# Patient Record
Sex: Female | Born: 1960 | Race: White | Hispanic: No | Marital: Married | Smoking: Former smoker
Health system: Southern US, Community
[De-identification: ages and names within clinical notes are randomized; demographics above are authoritative.]

## PROBLEM LIST (undated history)

## (undated) DIAGNOSIS — R002 Palpitations: Secondary | ICD-10-CM

## (undated) DIAGNOSIS — D259 Leiomyoma of uterus, unspecified: Secondary | ICD-10-CM

## (undated) DIAGNOSIS — A6000 Herpesviral infection of urogenital system, unspecified: Secondary | ICD-10-CM

## (undated) DIAGNOSIS — J45909 Unspecified asthma, uncomplicated: Secondary | ICD-10-CM

## (undated) DIAGNOSIS — R232 Flushing: Secondary | ICD-10-CM

## (undated) DIAGNOSIS — T7840XA Allergy, unspecified, initial encounter: Secondary | ICD-10-CM

## (undated) DIAGNOSIS — H269 Unspecified cataract: Secondary | ICD-10-CM

## (undated) DIAGNOSIS — F329 Major depressive disorder, single episode, unspecified: Secondary | ICD-10-CM

## (undated) DIAGNOSIS — Z86010 Personal history of colonic polyps: Secondary | ICD-10-CM

## (undated) DIAGNOSIS — E785 Hyperlipidemia, unspecified: Secondary | ICD-10-CM

## (undated) DIAGNOSIS — K219 Gastro-esophageal reflux disease without esophagitis: Secondary | ICD-10-CM

## (undated) DIAGNOSIS — J309 Allergic rhinitis, unspecified: Secondary | ICD-10-CM

## (undated) HISTORY — DX: Gastro-esophageal reflux disease without esophagitis: K21.9

## (undated) HISTORY — DX: Flushing: R23.2

## (undated) HISTORY — DX: Leiomyoma of uterus, unspecified: D25.9

## (undated) HISTORY — DX: Major depressive disorder, single episode, unspecified: F32.9

## (undated) HISTORY — DX: Unspecified cataract: H26.9

## (undated) HISTORY — DX: Unspecified asthma, uncomplicated: J45.909

## (undated) HISTORY — DX: Herpesviral infection of urogenital system, unspecified: A60.00

## (undated) HISTORY — PX: COLONOSCOPY: SHX174

## (undated) HISTORY — DX: Allergic rhinitis, unspecified: J30.9

## (undated) HISTORY — DX: Allergy, unspecified, initial encounter: T78.40XA

## (undated) HISTORY — DX: Palpitations: R00.2

## (undated) HISTORY — DX: Personal history of colonic polyps: Z86.010

## (undated) HISTORY — PX: POLYPECTOMY: SHX149

## (undated) HISTORY — DX: Hyperlipidemia, unspecified: E78.5

---

## 1988-06-25 HISTORY — PX: WISDOM TOOTH EXTRACTION: SHX21

## 1999-02-22 ENCOUNTER — Other Ambulatory Visit: Admission: RE | Admit: 1999-02-22 | Discharge: 1999-02-22 | Payer: Self-pay | Admitting: Obstetrics & Gynecology

## 1999-02-22 ENCOUNTER — Encounter (INDEPENDENT_AMBULATORY_CARE_PROVIDER_SITE_OTHER): Payer: Self-pay

## 2002-05-14 ENCOUNTER — Other Ambulatory Visit: Admission: RE | Admit: 2002-05-14 | Discharge: 2002-05-14 | Payer: Self-pay | Admitting: Obstetrics & Gynecology

## 2003-07-23 ENCOUNTER — Other Ambulatory Visit: Admission: RE | Admit: 2003-07-23 | Discharge: 2003-07-23 | Payer: Self-pay | Admitting: Obstetrics & Gynecology

## 2004-10-05 ENCOUNTER — Other Ambulatory Visit: Admission: RE | Admit: 2004-10-05 | Discharge: 2004-10-05 | Payer: Self-pay | Admitting: Obstetrics & Gynecology

## 2004-11-22 ENCOUNTER — Ambulatory Visit: Payer: Self-pay | Admitting: Pulmonary Disease

## 2005-06-07 ENCOUNTER — Ambulatory Visit: Payer: Self-pay | Admitting: Pulmonary Disease

## 2006-03-21 ENCOUNTER — Ambulatory Visit: Payer: Self-pay | Admitting: Pulmonary Disease

## 2006-07-09 ENCOUNTER — Ambulatory Visit: Payer: Self-pay | Admitting: Pulmonary Disease

## 2006-07-09 LAB — CONVERTED CEMR LAB
ALT: 9 units/L (ref 0–40)
Basophils Relative: 0.6 % (ref 0.0–1.0)
Bilirubin Urine: NEGATIVE
Calcium: 8.7 mg/dL (ref 8.4–10.5)
Chloride: 103 meq/L (ref 96–112)
Creatinine, Ser: 1 mg/dL (ref 0.4–1.2)
Eosinophils Relative: 0.5 % (ref 0.0–5.0)
GFR calc Af Amer: 77 mL/min
Glucose, Bld: 83 mg/dL (ref 70–99)
HDL: 53.9 mg/dL (ref >39.0)
Hemoglobin, Urine: NEGATIVE
Ketones, ur: NEGATIVE mg/dL
LDL Cholesterol: 102 mg/dL — ABNORMAL HIGH (ref 0–99)
Lymphocytes Relative: 31.1 % (ref 12.0–46.0)
MCHC: 33 g/dL (ref 30.0–36.0)
MCV: 93 fL (ref 78.0–100.0)
Monocytes Absolute: 0.4 10*3/uL (ref 0.2–0.7)
Neutro Abs: 4.1 10*3/uL (ref 1.4–7.7)
Platelets: 255 10*3/uL (ref 150–400)
Sodium: 136 meq/L (ref 135–145)
Specific Gravity, Urine: >=1.03 (ref 1.000–1.03)
TSH: 1.31 microintl units/mL (ref 0.35–5.50)
Total CHOL/HDL Ratio: 3.2
Total Protein, Urine: NEGATIVE mg/dL
Triglycerides: 86 mg/dL (ref 0–149)
Urine Glucose: NEGATIVE mg/dL
VLDL: 17 mg/dL (ref 0–40)
WBC: 6.5 10*3/uL (ref 4.5–10.5)
pH: 6 (ref 5.0–8.0)

## 2007-02-03 ENCOUNTER — Encounter: Admission: RE | Admit: 2007-02-03 | Discharge: 2007-02-03 | Payer: Self-pay | Admitting: Obstetrics & Gynecology

## 2007-06-13 ENCOUNTER — Telehealth: Payer: Self-pay | Admitting: Pulmonary Disease

## 2007-06-27 ENCOUNTER — Emergency Department (HOSPITAL_COMMUNITY): Admission: EM | Admit: 2007-06-27 | Discharge: 2007-06-27 | Payer: Self-pay | Admitting: Emergency Medicine

## 2007-06-30 ENCOUNTER — Telehealth (INDEPENDENT_AMBULATORY_CARE_PROVIDER_SITE_OTHER): Payer: Self-pay | Admitting: *Deleted

## 2007-06-30 ENCOUNTER — Ambulatory Visit: Payer: Self-pay | Admitting: Pulmonary Disease

## 2007-06-30 DIAGNOSIS — J209 Acute bronchitis, unspecified: Secondary | ICD-10-CM | POA: Insufficient documentation

## 2007-07-02 ENCOUNTER — Encounter: Payer: Self-pay | Admitting: Pulmonary Disease

## 2007-09-23 ENCOUNTER — Ambulatory Visit: Payer: Self-pay | Admitting: Pulmonary Disease

## 2007-09-23 DIAGNOSIS — F3289 Other specified depressive episodes: Secondary | ICD-10-CM

## 2007-09-23 DIAGNOSIS — E785 Hyperlipidemia, unspecified: Secondary | ICD-10-CM

## 2007-09-23 DIAGNOSIS — F329 Major depressive disorder, single episode, unspecified: Secondary | ICD-10-CM

## 2007-09-23 DIAGNOSIS — J309 Allergic rhinitis, unspecified: Secondary | ICD-10-CM

## 2007-09-23 DIAGNOSIS — F32A Depression, unspecified: Secondary | ICD-10-CM | POA: Insufficient documentation

## 2007-09-23 DIAGNOSIS — K219 Gastro-esophageal reflux disease without esophagitis: Secondary | ICD-10-CM | POA: Insufficient documentation

## 2007-09-23 HISTORY — DX: Hyperlipidemia, unspecified: E78.5

## 2007-09-23 HISTORY — DX: Major depressive disorder, single episode, unspecified: F32.9

## 2007-09-23 HISTORY — DX: Other specified depressive episodes: F32.89

## 2007-09-23 HISTORY — DX: Allergic rhinitis, unspecified: J30.9

## 2007-09-23 LAB — CONVERTED CEMR LAB: Vit D, 1,25-Dihydroxy: 55 (ref 30–89)

## 2007-10-02 ENCOUNTER — Telehealth: Payer: Self-pay | Admitting: Pulmonary Disease

## 2008-03-11 ENCOUNTER — Telehealth (INDEPENDENT_AMBULATORY_CARE_PROVIDER_SITE_OTHER): Payer: Self-pay | Admitting: *Deleted

## 2008-03-24 ENCOUNTER — Ambulatory Visit: Payer: Self-pay | Admitting: Pulmonary Disease

## 2008-06-15 ENCOUNTER — Ambulatory Visit: Payer: Self-pay | Admitting: Pulmonary Disease

## 2008-12-27 ENCOUNTER — Emergency Department (HOSPITAL_COMMUNITY): Admission: EM | Admit: 2008-12-27 | Discharge: 2008-12-27 | Payer: Self-pay | Admitting: Emergency Medicine

## 2009-02-25 ENCOUNTER — Emergency Department (HOSPITAL_COMMUNITY): Admission: EM | Admit: 2009-02-25 | Discharge: 2009-02-25 | Payer: Self-pay | Admitting: Emergency Medicine

## 2009-03-18 ENCOUNTER — Ambulatory Visit: Payer: Self-pay | Admitting: Pulmonary Disease

## 2009-03-19 LAB — CONVERTED CEMR LAB
LDL Cholesterol: 119 mg/dL — ABNORMAL HIGH (ref 0–99)
Total CHOL/HDL Ratio: 4
Triglycerides: 71 mg/dL (ref 0.0–149.0)
VLDL: 14.2 mg/dL (ref 0.0–40.0)

## 2009-06-10 ENCOUNTER — Telehealth: Payer: Self-pay | Admitting: Pulmonary Disease

## 2010-03-21 ENCOUNTER — Telehealth (INDEPENDENT_AMBULATORY_CARE_PROVIDER_SITE_OTHER): Payer: Self-pay | Admitting: *Deleted

## 2010-05-25 ENCOUNTER — Ambulatory Visit: Payer: Self-pay | Admitting: Pulmonary Disease

## 2010-05-27 LAB — CONVERTED CEMR LAB
AST: 23 units/L (ref 0–37)
Albumin: 3.9 g/dL (ref 3.5–5.2)
BUN: 11 mg/dL (ref 6–23)
Basophils Relative: 0.7 % (ref 0.0–3.0)
Calcium: 8.4 mg/dL (ref 8.4–10.5)
Direct LDL: 126.3 mg/dL
Eosinophils Relative: 0.4 % (ref 0.0–5.0)
GFR calc non Af Amer: 65.67 mL/min (ref >60)
Glucose, Bld: 79 mg/dL (ref 70–99)
HCT: 39.3 % (ref 36.0–46.0)
Hemoglobin: 13.6 g/dL (ref 12.0–15.0)
Lymphs Abs: 2.9 10*3/uL (ref 0.7–4.0)
MCV: 91.7 fL (ref 78.0–100.0)
Monocytes Absolute: 0.7 10*3/uL (ref 0.1–1.0)
Monocytes Relative: 7.8 % (ref 3.0–12.0)
Neutro Abs: 4.7 10*3/uL (ref 1.4–7.7)
Platelets: 252 10*3/uL (ref 150.0–400.0)
Potassium: 3.9 meq/L (ref 3.5–5.1)
Sodium: 135 meq/L (ref 135–145)
TSH: 1.16 microintl units/mL (ref 0.35–5.50)
Total Bilirubin: 1.3 mg/dL — ABNORMAL HIGH (ref 0.3–1.2)
Total CHOL/HDL Ratio: 4
VLDL: 19.4 mg/dL (ref 0.0–40.0)
WBC: 8.3 10*3/uL (ref 4.5–10.5)

## 2010-07-16 ENCOUNTER — Encounter: Payer: Self-pay | Admitting: Obstetrics & Gynecology

## 2010-07-23 LAB — CONVERTED CEMR LAB
ALT: 18 units/L (ref 0–35)
AST: 21 units/L (ref 0–37)
Albumin: 3.8 g/dL (ref 3.5–5.2)
BUN: 9 mg/dL (ref 6–23)
Basophils Absolute: 0 10*3/uL (ref 0.0–0.1)
Basophils Relative: 0.3 % (ref 0.0–1.0)
Bilirubin Urine: NEGATIVE
Calcium: 8.7 mg/dL (ref 8.4–10.5)
Chloride: 104 meq/L (ref 96–112)
Creatinine, Ser: 1 mg/dL (ref 0.4–1.2)
Eosinophils Absolute: 0 10*3/uL (ref 0.0–0.7)
GFR calc non Af Amer: 63 mL/min
HCT: 43 % (ref 36.0–46.0)
Hemoglobin, Urine: NEGATIVE
Hemoglobin: 14.1 g/dL (ref 12.0–15.0)
LDL Cholesterol: 100 mg/dL — ABNORMAL HIGH (ref 0–99)
Leukocytes, UA: NEGATIVE
Lymphocytes Relative: 30.1 % (ref 12.0–46.0)
MCHC: 32.8 g/dL (ref 30.0–36.0)
MCV: 92.7 fL (ref 78.0–100.0)
Monocytes Absolute: 0.4 10*3/uL (ref 0.1–1.0)
Neutro Abs: 4.9 10*3/uL (ref 1.4–7.7)
Nitrite: NEGATIVE
RDW: 12.2 % (ref 11.5–14.6)
Specific Gravity, Urine: 1.02 (ref 1.000–1.03)
TSH: 1.66 microintl units/mL (ref 0.35–5.50)
Total Bilirubin: 0.9 mg/dL (ref 0.3–1.2)
Urine Glucose: NEGATIVE mg/dL
Urobilinogen, UA: 0.2 (ref 0.0–1.0)
WBC, UA: NONE SEEN cells/hpf
pH: 6 (ref 5.0–8.0)

## 2010-07-25 NOTE — Progress Notes (Signed)
Summary: Muscle spasms  Phone Note Call from Patient Call back at [phone]   Caller: pt Summary of Call: Pt c/o having muscle spasms in her back x 3 days. Pt states SN has prescribed a muscle relaxer in the past that has helped and wants a prescripion for a muscle relaxer.  Pt last seen 02-2009. She scheduled an appt for 05-25-10 at 11:30. Please advise.   [facility] Initial call taken by: Carron Curie CMA,  March 21, 2010 8:41 AM  Follow-up for Phone Call        per SN---ok for pt to have robaxin 500mg   #50   generic is ok--1 by mouth three times a day as needed for muscle spasms and refill x 5.  thanks Randell Loop CMA  March 21, 2010 9:48 AM   Additional Follow-up for Phone Call Additional follow up Details #1::        Spoke with pt and notified that rx was sent to pharm.   Additional Follow-up by: Vernie Murders,  March 21, 2010 9:56 AM    New/Updated Medications: ROBAXIN 500 MG TABS (METHOCARBAMOL) 1 three times a day as needed Prescriptions: ROBAXIN 500 MG TABS (METHOCARBAMOL) 1 three times a day as needed  #50 x 5   Entered by:   Vernie Murders   Authorized by:   Michele Mcalpine MD   Signed by:   Vernie Murders on 03/21/2010   Method used:   Electronically to        [facility]  [phone]* (retail)       [address] [place], [place]  [address]       Ph: [phone] or [phone]       Fax: [phone]   RxID:   [id number]

## 2010-07-25 NOTE — Assessment & Plan Note (Signed)
Summary: 1 year follow-up//jrc   CC:  14 month ROV & CPX....  History of Present Illness: 50 y/o WF here for a follow up visit and CPX... she is Claudia Warner daughter...   ~  March 18, 2009:  she has had some incr anxiety/ stress and developed palpit- went to ER 02/25/09 w/ neg eval... EKG- NAD;  CXR- NAD;  CT Angio- neg;  Labs- neg... pt was reassured & no meds given... prev eval & Rx by [name] for Psyche & prev on Wellbutrin & Lexapro but she weaned off all these 2/10 on her own... offered anxiolytic Rx vs return to see [name] & she states she will call Psyche for followup...   ~  May 25, 2010:  she is c/o tired alot & incr irritable, difficulty conc, emotional, "short fuse", HAs, etc... she never followed up w/ [name] as suggested last yr- we decided to Rx w/ Lexapro 10mg  samples & she will sched her own appt w/ psyche... states breathing is OK;  denies palpit as long as she avoid caffeine;  Chol is reasonable on diet Rx but not optimal (she doesn't want meds);  GERD controlled w/ PPI... we discussed Flu vaccine & TDAP today.   Current Problems:   PHYSICAL EXAMINATION (ICD-V70.0) - her GYN is [name] & [name] on Mononessa BCP now and Valtrex 500mg - 1/2 tab daily;  up-to-date on PAP, Mammogram, etc... she is on a BCP- calcium + vits...  ~  last colonoscopy 3/05 by [name] was normal... f/u planned 7 yrs.  ALLERGIC RHINITIS (ICD-477.9) - prev on shots & Flonase per DrESL... pt stopped these on her own due to weight gain...  ASTHMATIC BRONCHITIS, ACUTE (ICD-466.0) - she had a URI/ exac in Jan09 treated by TParrett,NP and improved...  Hx of PALPITATIONS (ICD-785.1) - hx occas palpit when stressed or from caffeine...  HYPERCHOLESTEROLEMIA, BORDERLINE (ICD-272.4) - on diet + exercise... she walks regularly and golfs some...  ~  FLP 1/08 showed TChol 173, TG 86, HDL 54, LDL 102  ~  FLP 3/09 showed TChol 175, TG 114, HDL 52, LDL 100  ~  FLP 9/10 showed TChol 181, TG 71, HDL 48, LDL  119  ~  FLP 11/11 showed TChol 205, TG 97, HDL 54, LDL 126... we discussed diet, exercise, etc.  GERD (ICD-530.81) - she takes PRILOSEC 20mg /d...  ANXIETY/ DEPRESSION (ICD-311) - Eval and Rx by [name]... prev on Wellbutrin XL and Lexapro- she weaned off meds on her own 2/10...   ~  9/10: pt went to ER w/ palpit & felt to be panic attack by EDP- she will ret to [name] for Rx (never did).  ~  11/11:  we gave samples LEXAPRO 10mg  & she has f/u appt w/ [name] in several weeks.   Preventive Screening-Counseling & Management  Alcohol-Tobacco     Smoking Status: quit     Year Quit: 1980's     Pack years: 1-2  Allergies: 1)  ! * Antihistamines  Comments:  Nurse/Medical Assistant: The patient's medications and allergies were reviewed with the patient and were updated in the Medication and Allergy Lists.  Past History:  Past Medical History: ALLERGIC RHINITIS (ICD-477.9) ASTHMATIC BRONCHITIS, ACUTE (ICD-466.0) Hx of PALPITATIONS (ICD-785.1) HYPERCHOLESTEROLEMIA, BORDERLINE (ICD-272.4) GERD (ICD-530.81) DEPRESSION (ICD-311)  Family History: Reviewed history and no changes required.  Social History: Reviewed history and no changes required.  Review of Systems       The patient complains of fatigue, malaise, palpitations, dyspnea on exertion, depression, and hay fever.  The patient denies fever, chills, sweats, anorexia, weakness, weight loss, sleep disorder, blurring, diplopia, eye irritation, eye discharge, vision loss, eye pain, photophobia, earache, ear discharge, tinnitus, decreased hearing, nasal congestion, nosebleeds, sore throat, hoarseness, chest pain, syncope, orthopnea, PND, peripheral edema, cough, dyspnea at rest, excessive sputum, hemoptysis, wheezing, pleurisy, nausea, vomiting, diarrhea, constipation, change in bowel habits, abdominal pain, melena, hematochezia, jaundice, gas/bloating, indigestion/heartburn, dysphagia, odynophagia, dysuria, hematuria, urinary frequency,  urinary hesitancy, nocturia, incontinence, back pain, joint pain, joint swelling, muscle cramps, muscle weakness, stiffness, arthritis, sciatica, restless legs, leg pain at night, leg pain with exertion, rash, itching, dryness, suspicious lesions, paralysis, paresthesias, seizures, tremors, vertigo, transient blindness, frequent falls, frequent headaches, difficulty walking, anxiety, memory loss, confusion, cold intolerance, heat intolerance, polydipsia, polyphagia, polyuria, unusual weight change, abnormal bruising, bleeding, enlarged lymph nodes, urticaria, allergic rash, and recurrent infections.    Vital Signs:  Patient profile:   50 year old female Height:      66 inches Weight:      196 pounds BMI:     31.75 O2 Sat:      98 % on room air Temp:     97.5 degrees F oral Pulse rate:   66 / minute BP sitting:   122 / 80  (right arm) Cuff size:   regular  Vitals Entered By: Randell Loop CMA (May 25, 2010 11:30 AM)  O2 Sat at Rest %:  98 O2 Flow:  room air CC: 14 month ROV & CPX... Is Patient Diabetic? No Pain Assessment Patient in pain? yes      Onset of pain  bil ear pain at times Comments meds updated today with pt   Physical Exam  Additional Exam:  WD, WN, 50 y/o WF in NAD... GENERAL:  Alert & oriented; pleasant & cooperative. HEENT:  [place]/AT, EOM-wnl, PERRLA, Fundi-benign, EACs-clear, TMs-wnl, NOSE-clear, THROAT-clear & wnl. NECK:  Supple w/ full ROM; no JVD; normal carotid impulses w/o bruits; no thyromegaly or nodules palpated; no lymphadenopathy. CHEST:  Clear to P & A; without wheezes/ rales/ or rhonchi. HEART:  Regular Rhythm; without murmurs/ rubs/ or gallops. ABDOMEN:  Soft & nontender; normal bowel sounds; no organomegaly or masses detected. EXT: without deformities or arthritic changes; no varicose veins/ venous insuffic/ or edema. NEURO:  CN's intact; motor testing normal; sensory testing normal; gait normal & balance OK. DERM:  No lesions noted; no rash  etc...    MISC. Report  Procedure date:  05/25/2010  Findings:      BMP (METABOL)   Sodium                    135 mEq/L                   135-145   Potassium                 3.9 mEq/L                   3.5-5.1   Chloride                  100 mEq/L                   96-112   Carbon Dioxide            26 mEq/L                    19-32   Glucose  79 mg/dL                    19-14   BUN                       11 mg/dL                    7-82   Creatinine                1.0 mg/dL                   9.5-6.2   Calcium                   8.4 mg/dL                   1.3-08.6   GFR                       65.67 mL/min                >60  Hepatic/Liver Function Panel (HEPATIC)   Total Bilirubin      [H]  1.3 mg/dL                   5.7-8.4   Direct Bilirubin          0.2 mg/dL                   6.9-6.2   Alkaline Phosphatase      68 U/L                      39-117   AST                       23 U/L                      0-37   ALT                       14 U/L                      0-35   Total Protein             7.1 g/dL                    9.5-2.8   Albumin                   3.9 g/dL                    4.1-3.2  CBC Platelet w/Diff (CBCD)   White Cell Count          8.3 K/uL                    4.5-10.5   Red Cell Count            4.28 Mil/uL                 3.87-5.11   Hemoglobin                13.6 g/dL                   44.0-10.2   Hematocrit  39.3 %                      36.0-46.0   MCV                       91.7 fl                     78.0-100.0   Platelet Count            252.0 K/uL                  150.0-400.0   Neutrophil %              56.2 %                      43.0-77.0   Lymphocyte %              34.9 %                      12.0-46.0   Monocyte %                7.8 %                       3.0-12.0   Eosinophils%              0.4 %                       0.0-5.0   Basophils %               0.7 %                       0.0-3.0  Comments:      Lipid Panel  (LIPID)   Cholesterol          [H]  205 mg/dL                   1-610   Triglycerides             97.0 mg/dL                  9.6-045.4   HDL                       09.81 mg/dL                 >19.14        Cholesterol LDL - Direct                             126.3 mg/dL  TSH (TSH)   FastTSH                   1.16 uIU/mL                 0.35-5.50   Impression & Recommendations:  Problem # 1:  PHYSICAL EXAMINATION (ICD-V70.0)  Orders: T-Vitamin D (25-Hydroxy) ([address]295-62130) TLB-BMP (Basic Metabolic Panel-BMET) (80048-METABOL) TLB-Hepatic/Liver Function Pnl (80076-HEPATIC) TLB-CBC Platelet - w/Differential (85025-CBCD) TLB-Lipid Panel (80061-LIPID) TLB-TSH (Thyroid Stimulating Hormone) (84443-TSH)  Problem # 2:  ASTHMATIC BRONCHITIS, ACUTE (ICD-466.0) No recent exac- doing well... The following medications were removed from the medication list:    Augmentin 875-125 Mg Tabs (Amoxicillin-pot clavulanate) .Marland KitchenMarland KitchenMarland KitchenMarland Kitchen  Take one tablet by mouth two times a day until gone  Problem # 3:  Hx of PALPITATIONS (ICD-785.1) Stable as long as she avoids pseudophed, caffeine, etc...  Problem # 4:  HYPERCHOLESTEROLEMIA, BORDERLINE (ICD-272.4) TChol & LDL are sl up>  rec diet efforts- low chol/ low fat...  Problem # 5:  GERD (ICD-530.81) stable on PPI daily... Her updated medication list for this problem includes:    Omeprazole 20 Mg Cpdr (Omeprazole) .Marland Kitchen... Take 1 tab by mouth once daily- 30 min before the 1st meal of the day...  Problem # 6:  DEPRESSION (ICD-311) We discussed restarting LEXAPRO 10mg  in advance of her f/u appt w/ [name]> smaples given.  Complete Medication List: 1)  Aspirin 81 Mg Tbec (Aspirin) .... Take 1 tablet by mouth once a day 2)  Omeprazole 20 Mg Cpdr (Omeprazole) .... Take 1 tab by mouth once daily- 30 min before the 1st meal of the day... 3)  Mononessa 0.25-35 Mg-mcg Tabs (Norgestimate-eth estradiol) .... Take 1 tablet by mouth once a day as directed by dr Okey Regal  curtisl... 4)  Valtrex 500 Mg Tabs (Valacyclovir hcl) .... 1/2 tablet by mouth as directed by dr Julio Sicks.Marland KitchenMarland Kitchen 5)  Caltrate 600+d 600-400 Mg-unit Tabs (Calcium carbonate-vitamin d) .... Take one tablet by mouth two times a day 6)  Centrum Tabs (Multiple vitamins-minerals) .... Take 1 tablet by mouth once a day 7)  Vitamin B-12 1000 Mcg Tabs (Cyanocobalamin) .... Take 1 tablet by mouth once a day 8)  Vitamin D 1000 Unit Tabs (Cholecalciferol) .... Take 1 tablet by mouth once a day 9)  Coq10 Maximum Strength 400 Mg Caps (Coenzyme q10) .... Take 1 tablet by mouth once a day 10)  Robaxin 500 Mg Tabs (Methocarbamol) .Marland Kitchen.. 1 three times a day as needed  Other Orders: Tdap => 70yrs IM ([address]) Admin 1st Vaccine ([address]) Influenza Vaccine NON MCR (09811)  Patient Instructions: 1)  Today we updated your med list- see below.... 2)  We refilled your Prilosec Rx & gave you a month's supply of the Lexapro 10mg  tabs... 3)  Today we did your follow up FASTING blood work... please call the "phone tree" in a few days for your lab results.Marland KitchenMarland Kitchen 4)  Call for any questions.Marland KitchenMarland Kitchen 5)  Please schedule a follow-up appointment in 1 year, sooner as needed... Prescriptions: OMEPRAZOLE 20 MG CPDR (OMEPRAZOLE) take 1 tab by mouth once daily- 30 min before the 1st meal of the day...  #90 x 4   Entered and Authorized by:   Michele Mcalpine MD   Signed by:   Michele Mcalpine MD on 05/25/2010   Method used:   Print then Give to Patient   RxID:   [id number]    Immunizations Administered:  Tetanus Vaccine:    Vaccine Type: Tdap    Site: left deltoid    Mfr: boostrix    Dose: 0.5 ml    Route: IM    Given by: Randell Loop CMA    Exp. Date: 03/15/2012    Lot #: [id number]    VIS given: 05/12/08 version given May 25, 2010.  Influenza Vaccine # 1:    Vaccine Type: Fluvax Non-MCR    Site: right deltoid    Mfr: GlaxoSmithKline    Dose: 0.5 ml    Route: IM    Given by: Randell Loop CMA    Exp. Date: 12/23/2010     Lot #: [id number]    VIS given: 01/17/10 version given May 25, 2010.  Flu Vaccine Consent Questions:    Do you have a history of severe allergic reactions to this vaccine? no    Any prior history of allergic reactions to egg and/or gelatin? no    Do you have a sensitivity to the preservative Thimersol? no    Do you have a past history of Guillan-Barre Syndrome? no    Do you currently have an acute febrile illness? no    Have you ever had a severe reaction to latex? no    Vaccine information given and explained to patient? yes    Are you currently pregnant? no

## 2010-09-07 ENCOUNTER — Encounter: Payer: Self-pay | Admitting: Internal Medicine

## 2010-09-12 NOTE — Letter (Signed)
Summary: Colonoscopy Date Change Letter  [facility]  [address]   [place], [place] [address]   Phone: [phone]  Fax: [phone]      September 07, 2010 MRN: [id number]   [facility] [address] [place], [place]  [address]   Dear Ms. Erb,   Previously you were recommended to have a repeat colonoscopy around this time. Your chart was recently reviewed by Dr. Leone Payor of [facility]. Follow up colonoscopy is now recommended in January 2013. This revised recommendation is based on current, nationally recognized guidelines for colorectal cancer screening and polyp surveillance. These guidelines are endorsed by the American Cancer Society, [employer] on Colorectal Cancer as well as numerous other major medical organizations.  Please understand that our recommendation assumes that you do not have any new symptoms such as bleeding, a change in bowel habits, anemia, or significant abdominal discomfort. If you do have any concerning GI symptoms or want to discuss the guideline recommendations, please call to arrange an office visit at your earliest convenience. Otherwise we will keep you in our reminder system and contact you 1-2 months prior to the date listed above to schedule your next colonoscopy.  Thank you,   Stan Head, M.D.  [facility] [phone]

## 2010-09-29 LAB — D-DIMER, QUANTITATIVE: D-Dimer, Quant: 0.77 ug/mL-FEU — ABNORMAL HIGH (ref 0.00–0.48)

## 2010-09-29 LAB — POCT CARDIAC MARKERS: Myoglobin, poc: 78.2 ng/mL (ref 12–200)

## 2010-09-29 LAB — URINALYSIS, ROUTINE W REFLEX MICROSCOPIC
Bilirubin Urine: NEGATIVE
Hgb urine dipstick: NEGATIVE
Ketones, ur: 15 mg/dL — AB
Specific Gravity, Urine: 1.025 (ref 1.005–1.030)
pH: 6 (ref 5.0–8.0)

## 2010-09-29 LAB — BASIC METABOLIC PANEL
CO2: 24 mEq/L (ref 19–32)
Calcium: 9.4 mg/dL (ref 8.4–10.5)
Creatinine, Ser: 0.95 mg/dL (ref 0.4–1.2)
GFR calc Af Amer: >60 mL/min (ref >60)
GFR calc non Af Amer: >60 mL/min (ref >60)
Sodium: 139 mEq/L (ref 135–145)

## 2010-09-29 LAB — CBC
Hemoglobin: 15.4 g/dL — ABNORMAL HIGH (ref 12.0–15.0)
MCHC: 34.2 g/dL (ref 30.0–36.0)
RBC: 4.77 MIL/uL (ref 3.87–5.11)
WBC: 7.6 10*3/uL (ref 4.0–10.5)

## 2010-09-29 LAB — DIFFERENTIAL
Basophils Relative: 0 % (ref 0–1)
Lymphocytes Relative: 25 % (ref 12–46)
Monocytes Relative: 8 % (ref 3–12)
Neutro Abs: 5 10*3/uL (ref 1.7–7.7)
Neutrophils Relative %: 67 % (ref 43–77)

## 2010-09-29 LAB — URINE CULTURE
Colony Count: NO GROWTH
Culture: NO GROWTH

## 2010-09-29 LAB — POCT PREGNANCY, URINE: Preg Test, Ur: NEGATIVE

## 2010-10-17 ENCOUNTER — Telehealth: Payer: Self-pay | Admitting: Pulmonary Disease

## 2010-10-17 MED ORDER — MAGIC MOUTHWASH: ORAL | Status: DC

## 2010-10-17 MED ORDER — AMOXICILLIN-POT CLAVULANATE 875-125 MG PO TABS: 1.0000 | ORAL_TABLET | Freq: Two times a day (BID) | ORAL | Status: AC

## 2010-10-17 NOTE — Telephone Encounter (Signed)
Spoke w/ pt and she is aware of SN recs. Pt aware rx's was called into pharmacy and nothing further was needed

## 2010-10-17 NOTE — Telephone Encounter (Signed)
Called, spoke with pt.  She c/o sore throat since Saturday.  Also, having sinus drainage, HA, and soreness in ears.  Denies f/c/s.  Nkda. [facility].  Dr. Kriste Basque, pls advise.  Thanks!

## 2010-10-17 NOTE — Telephone Encounter (Signed)
Per SN--no openings--ok to call something in?  recs  For augmentin 875mg   #14   1 po bid until gone, and MMW  #4oz  1 tsp gargle and swallow four times daily prn, and she will need ov if not better. thanks

## 2010-10-19 ENCOUNTER — Telehealth: Payer: Self-pay | Admitting: Pulmonary Disease

## 2010-10-19 NOTE — Telephone Encounter (Signed)
Spoke w/ pt and advised her of this. Pt verbalized understanding and nothing further was needed

## 2010-10-19 NOTE — Telephone Encounter (Signed)
Pt states she just found out that a female she was around "alot" has been dx with strep throat. Pt has no new complaints. Throat is a little better. Please advise if any other actions needs to be taken. Thanks. [facility]

## 2010-10-19 NOTE — Telephone Encounter (Signed)
The augmentin would treat this---pt should cont with the mouthwash and if symptoms persist then pt will need ov to be checked. thanks

## 2010-11-10 NOTE — Assessment & Plan Note (Signed)
[facility]                               PULMONARY OFFICE NOTE   Claudia Warner, Claudia Warner                          MRN:          [id number]  DATE:03/21/2006                            DOB:          October 16, 1960    HISTORY OF PRESENT ILLNESS:  The patient is a 50 year old, white female  patient of Dr. Kriste Basque who presents for an acute office visit.  The patient  complains of a 4-day history of nasal congestion, sore throat, post nasal  drainage and cough.  The patient complains of some mild yellow sputum.  She  denies any hemoptysis, chest pain, recent travel, antibiotic use, nausea or  vomiting.  The patient has not used any over-the-counter products for  treatment.   PAST MEDICAL HISTORY:  Reviewed.   CURRENT MEDICATIONS:  Reviewed.   PHYSICAL EXAMINATION:  GENERAL:  The patient is a pleasant female in no  acute distress.  VITAL SIGNS:  She is afebrile with stable vital signs.  O2 saturations 97%  on room air.  HEENT:  Nasal mucosa with some mild redness.  Nontender sinuses.  TMs are  normal.  NECK:  Supple without adenopathy.  LUNGS:  Clear.  CARDIAC:  Regular rate.  ABDOMEN:  Soft and benign.  EXTREMITIES:  Warm without any edema.   ASSESSMENT/PLAN:  Acute upper respiratory infection.  Suspect this is viral  in nature.  The patient his beginning nasal hygiene regimen with Nasacort  AQ, Afrin and saline nasal spray x5 days.  Instructional sheet was given.  Adding Mucinex DM twice daily until symptoms resolve.  The patient may use  Endal HD 8 ounces 1-2 tsp every 4-6h. as needed for cough.  The patient is  to beware of the sedating effect.  The patient was given a prescription for  Z-pack to have on hold if symptoms persist greater than 7 days with  persistent purulent sputum.  The patient is to return here in 1 month with  Dr. Kriste Basque for complete physical exam or sooner if needed.      ______________________________  Rubye Oaks, NP    ______________________________  Lonzo Cloud. Kriste Basque, MD    TP/MedQ  DD:  03/21/2006  DT:  03/23/2006  Job #:  [id number]

## 2011-05-14 ENCOUNTER — Ambulatory Visit (INDEPENDENT_AMBULATORY_CARE_PROVIDER_SITE_OTHER): Payer: 59

## 2011-05-14 DIAGNOSIS — Z23 Encounter for immunization: Secondary | ICD-10-CM

## 2011-05-31 ENCOUNTER — Other Ambulatory Visit: Payer: Self-pay | Admitting: Pulmonary Disease

## 2011-06-05 ENCOUNTER — Ambulatory Visit (INDEPENDENT_AMBULATORY_CARE_PROVIDER_SITE_OTHER)
Admission: RE | Admit: 2011-06-05 | Discharge: 2011-06-05 | Disposition: A | Discharge status: Home / Self Care | Payer: 59 | Source: Ambulatory Visit | Attending: Pulmonary Disease | Admitting: Pulmonary Disease

## 2011-06-05 ENCOUNTER — Other Ambulatory Visit: Payer: Self-pay | Admitting: Pulmonary Disease

## 2011-06-05 ENCOUNTER — Other Ambulatory Visit (INDEPENDENT_AMBULATORY_CARE_PROVIDER_SITE_OTHER): Payer: 59

## 2011-06-05 ENCOUNTER — Ambulatory Visit (INDEPENDENT_AMBULATORY_CARE_PROVIDER_SITE_OTHER): Payer: 59 | Admitting: Pulmonary Disease

## 2011-06-05 ENCOUNTER — Encounter: Payer: Self-pay | Admitting: Pulmonary Disease

## 2011-06-05 VITALS — BP 114/76 | HR 87 | Temp 97.9°F | Ht 66.0 in | Wt 176.2 lb

## 2011-06-05 DIAGNOSIS — K219 Gastro-esophageal reflux disease without esophagitis: Secondary | ICD-10-CM

## 2011-06-05 DIAGNOSIS — E785 Hyperlipidemia, unspecified: Secondary | ICD-10-CM

## 2011-06-05 DIAGNOSIS — R002 Palpitations: Secondary | ICD-10-CM

## 2011-06-05 DIAGNOSIS — Z0000 Encounter for general adult medical examination without abnormal findings: Secondary | ICD-10-CM

## 2011-06-05 DIAGNOSIS — J309 Allergic rhinitis, unspecified: Secondary | ICD-10-CM

## 2011-06-05 DIAGNOSIS — F329 Major depressive disorder, single episode, unspecified: Secondary | ICD-10-CM

## 2011-06-05 LAB — HEPATIC FUNCTION PANEL
ALT: 13 U/L (ref 0–35)
AST: 17 U/L (ref 0–37)
Albumin: 4 g/dL (ref 3.5–5.2)
Alkaline Phosphatase: 82 U/L (ref 39–117)
Total Protein: 7.7 g/dL (ref 6.0–8.3)

## 2011-06-05 LAB — URINALYSIS, ROUTINE W REFLEX MICROSCOPIC
Bilirubin Urine: NEGATIVE
Leukocytes, UA: NEGATIVE
Specific Gravity, Urine: 1.025 (ref 1.000–1.030)
Urobilinogen, UA: 0.2 (ref 0.0–1.0)

## 2011-06-05 LAB — CBC WITH DIFFERENTIAL/PLATELET
Basophils Absolute: 0 10*3/uL (ref 0.0–0.1)
HCT: 39.9 % (ref 36.0–46.0)
Lymphs Abs: 2.2 10*3/uL (ref 0.7–4.0)
Monocytes Relative: 7.1 % (ref 3.0–12.0)
Neutrophils Relative %: 58.8 % (ref 43.0–77.0)
Platelets: 281 10*3/uL (ref 150.0–400.0)
RDW: 13 % (ref 11.5–14.6)

## 2011-06-05 LAB — TSH: TSH: 1.6 u[IU]/mL (ref 0.35–5.50)

## 2011-06-05 LAB — BASIC METABOLIC PANEL
BUN: 10 mg/dL (ref 6–23)
CO2: 25 mEq/L (ref 19–32)
Chloride: 105 mEq/L (ref 96–112)
GFR: 57.08 mL/min — ABNORMAL LOW (ref >60.00)
Glucose, Bld: 86 mg/dL (ref 70–99)
Potassium: 4.3 mEq/L (ref 3.5–5.1)
Sodium: 138 mEq/L (ref 135–145)

## 2011-06-05 LAB — LIPID PANEL: Cholesterol: 187 mg/dL (ref 0–200)

## 2011-06-05 MED ORDER — OMEPRAZOLE 20 MG PO CPDR: 20.0000 mg | DELAYED_RELEASE_CAPSULE | Freq: Every day | ORAL | Status: DC

## 2011-06-05 NOTE — Progress Notes (Signed)
Subjective:    Patient ID: Claudia Warner, female    DOB: April 03, 1961, 50 y.o.   MRN: [id number]  HPI 50 y/o WF here for a follow up visit and CPX... she is Claudia Warner daughter...  ~  March 18, 2009:  she has had some incr anxiety/ stress and developed palpit- went to ER 02/25/09 w/ neg eval... EKG- NAD;  CXR- NAD;  CT Angio- neg;  Labs- neg... pt was reassured & no meds given... prev eval & Rx by [name] for Psyche & prev on Wellbutrin & Lexapro but she weaned off all these 2/10 on her own... offered anxiolytic Rx vs return to see [name] & she states she will call Psyche for followup...  ~  May 25, 2010:  she is c/o tired alot & incr irritable, difficulty conc, emotional, "short fuse", HAs, etc... she never followed up w/ [name] as suggested last yr- we decided to Rx w/ Lexapro 10mg  samples & she will sched her own appt w/ psyche... states breathing is OK;  denies palpit as long as she avoid caffeine;  Chol is reasonable on diet Rx but not optimal (she doesn't want meds);  GERD controlled w/ PPI... we discussed Flu vaccine & TDAP today.  ~  June 05, 2011:  Yearly ROV & CPX> she is c/o sl prob w/ memory which is most likely stress related/ benign forgetfulness- I offered her a Neuro referral for further eval & she will decide;  She has followed up w/ [name] & is currently taking WellbutrinSR 150mg -3tabs daily & Lexapro 10mg /d...           Problem List:    ALLERGIC RHINITIS (ICD-477.9) - prev on shots & Flonase per DrESL... pt stopped these on her own due to weight gain...  ASTHMATIC BRONCHITIS, ACUTE (ICD-466.0) - she had a URI/ exac in Jan09 treated by TParrett,NP and improved... ~  CXR 12/12 showed normal heart size, clear lungs, WNL.Marland KitchenMarland Kitchen  Hx of PALPITATIONS (ICD-785.1) - hx occas palpit when stressed or from caffeine... ~  She notes persistent but rare palpit "beating hard & fast" on & off, lasts just seconds & occurs <1/mo...  HYPERCHOLESTEROLEMIA, BORDERLINE (ICD-272.4) - on diet  + exercise... she walks regularly and golfs some... ~  FLP 1/08 showed TChol 173, TG 86, HDL 54, LDL 102 ~  FLP 3/09 showed TChol 175, TG 114, HDL 52, LDL 100 ~  FLP 9/10 showed TChol 181, TG 71, HDL 48, LDL 119 ~  FLP 11/11 showed TChol 205, TG 97, HDL 54, LDL 126... we discussed diet, exercise, etc. ~  FLP 12/12 showed TChol 187, TG 107, HDL 58, LDL 108... On CoQ10 & Krill Oil.  GERD (ICD-530.81) - she takes PRILOSEC 20mg /d...  COLONOSCOPY >> she had a routine screening colonoscopy in 2005 & rec to f/u in 10 yrs...  ANXIETY/ DEPRESSION (ICD-311) - Eval and Rx by [name]... prev on Wellbutrin XL and Lexapro- she weaned off meds on her own 2/10...  ~  9/10: pt went to ER w/ palpit & felt to be panic attack by EDP- she will ret to [name] for Rx (never did). ~  11/11:  we gave samples LEXAPRO 10mg  & she has f/u appt w/ [name] in several weeks.  PHYSICAL EXAMINATION (ICD-V70.0) - her GYN is [name] & [name] on Mononessa BCP now and Valtrex 500mg - 1/2 tab daily;  up-to-date on PAP, Mammogram, etc... she is on a BCP- calcium + vits... ~  last colonoscopy 3/05 by [name] was normal... f/u planned  7 yrs.   No past surgical history on file.   Outpatient Encounter Prescriptions as of 06/05/2011  Medication Sig Dispense Refill  . omeprazole (PRILOSEC) 20 MG capsule TAKE 1 CAPSULE DAILY 30 MINUTES BEFORE THE FIRST MEAL OF THE DAY  90 capsule  3  . DISCONTD: Alum & Mag Hydroxide-Simeth (MAGIC MOUTHWASH) SOLN 1 tsp gargle and swallow 4 times a day as needed  120 mL  0    Allergies  Allergen Reactions  . Sudafed (Pseudoephedrine Hcl)     Heart races    Current Medications, Allergies, Past Medical History, Past Surgical History, Family History, and Social History were reviewed in [employer] record.   Review of Systems        The patient complains of fatigue, malaise, palpitations, dyspnea on exertion, depression, and hay fever.   The patient denies fever, chills,  sweats, anorexia, weakness, weight loss, sleep disorder, blurring, diplopia, eye irritation, eye discharge, vision loss, eye pain, photophobia, earache, ear discharge, tinnitus, decreased hearing, nasal congestion, nosebleeds, sore throat, hoarseness, chest pain, syncope, orthopnea, PND, peripheral edema, cough, dyspnea at rest, excessive sputum, hemoptysis, wheezing, pleurisy, nausea, vomiting, diarrhea, constipation, change in bowel habits, abdominal pain, melena, hematochezia, jaundice, gas/bloating, indigestion/heartburn, dysphagia, odynophagia, dysuria, hematuria, urinary frequency, urinary hesitancy, nocturia, incontinence, back pain, joint pain, joint swelling, muscle cramps, muscle weakness, stiffness, arthritis, sciatica, restless legs, leg pain at night, leg pain with exertion, rash, itching, dryness, suspicious lesions, paralysis, paresthesias, seizures, tremors, vertigo, transient blindness, frequent falls, frequent headaches, difficulty walking, anxiety, memory loss, confusion, cold intolerance, heat intolerance, polydipsia, polyphagia, polyuria, unusual weight change, abnormal bruising, bleeding, enlarged lymph nodes, urticaria, allergic rash, and recurrent infections.     Objective:   Physical Exam     WD, WN, 50 y/o WF in NAD... GENERAL:  Alert & oriented; pleasant & cooperative. HEENT:  [place]/AT, EOM-wnl, PERRLA, Fundi-benign, EACs-clear, TMs-wnl, NOSE-clear, THROAT-clear & wnl. NECK:  Supple w/ full ROM; no JVD; normal carotid impulses w/o bruits; no thyromegaly or nodules palpated; no lymphadenopathy. CHEST:  Clear to P & A; without wheezes/ rales/ or rhonchi. HEART:  Regular Rhythm; without murmurs/ rubs/ or gallops. ABDOMEN:  Soft & nontender; normal bowel sounds; no organomegaly or masses detected. EXT: without deformities or arthritic changes; no varicose veins/ venous insuffic/ or edema. NEURO:  CN's intact; motor testing normal; sensory testing normal; gait normal & balance  OK. DERM:  No lesions noted; no rash etc...  RADIOLOGY DATA:  Reviewed in the EPIC EMR & discussed w/ the patient...  LABORATORY DATA:  Reviewed in the EPIC EMR & discussed w/ the patient...   Assessment & Plan:   CPX>>  AR/ AB>  Stable w/o infectious exac...  Hx Palpit>  Notes occas palpit but self limited & not progressive...  CHOL>  Improved on her diet, CoQ10, and Krill Oil...  GI> GERD, normal colon 2005>  Stable & up to date on screening...  Anxiety/ Depression>  Improved on Wellbutrin & Lexapro per [name]...   Patient's Medications  New Prescriptions   No medications on file  Previous Medications   ASPIRIN 81 MG TABLET    Take 81 mg by mouth daily.     BUPROPION (WELLBUTRIN SR) 150 MG 12 HR TABLET    Take 150 mg by mouth 3 (three) times daily.     CALCIUM CARBONATE-VITAMIN D (CALTRATE 600+D) 600-400 MG-UNIT PER TABLET    Take 1 tablet by mouth 2 (two) times daily.     CHOLECALCIFEROL (VITAMIN D)  2000 UNITS CAPS    Take 1 capsule by mouth daily.     COENZYME Q10 (COQ10) 100 MG CAPS    Take 1 capsule by mouth daily.     ESCITALOPRAM (LEXAPRO) 10 MG TABLET    Take 10 mg by mouth daily.     KRILL OIL 300 MG CAPS    Take 1 capsule by mouth daily.     MULTIPLE VITAMINS-MINERALS (CENTRUM PO)    Take 1 tablet by mouth daily.     NORGESTIMATE-ETHINYL ESTRADIOL (ORTHO-CYCLEN,SPRINTEC,PREVIFEM) 0.25-35 MG-MCG TABLET    Take 1 tablet by mouth daily.     VALACYCLOVIR (VALTREX) 1000 MG TABLET    Take as directed   VITAMIN B-12 (CYANOCOBALAMIN) 1000 MCG TABLET    Take 1,000 mcg by mouth daily.    Modified Medications   Modified Medication Previous Medication   OMEPRAZOLE (PRILOSEC) 20 MG CAPSULE omeprazole (PRILOSEC) 20 MG capsule      Take 1 capsule (20 mg total) by mouth daily. 30 minutes before the first meal of the day    TAKE 1 CAPSULE DAILY 30 MINUTES BEFORE THE FIRST MEAL OF THE DAY  Discontinued Medications   ALUM & MAG HYDROXIDE-SIMETH (MAGIC MOUTHWASH) SOLN    1 tsp  gargle and swallow 4 times a day as needed   CITALOPRAM (CELEXA) 10 MG TABLET    Take 10 mg by mouth daily.

## 2011-06-05 NOTE — Patient Instructions (Signed)
Today we updated your med list in our EPIC system...    Continue your current medications the same...    We refilled your Prilosec for a 90d supply as requested...  Today we did your follow up CXR & fasting blood work...    Please call the PHONE TREE in a few days for your results...    Dial [id number] & when prompted enter your patient number followed by the # symbol...    Your patient number is:  [id number]  Call for any questions...  Let's plan a follow up eval in one year's time, or sooner if needed for problems.Marland KitchenMarland Kitchen

## 2011-06-27 ENCOUNTER — Encounter: Payer: Self-pay | Admitting: Pulmonary Disease

## 2011-07-20 ENCOUNTER — Encounter: Payer: Self-pay | Admitting: Internal Medicine

## 2011-09-25 ENCOUNTER — Encounter: Payer: Self-pay | Admitting: Internal Medicine

## 2011-10-04 ENCOUNTER — Ambulatory Visit (AMBULATORY_SURGERY_CENTER): Payer: 59 | Admitting: *Deleted

## 2011-10-04 ENCOUNTER — Encounter: Payer: Self-pay | Admitting: Internal Medicine

## 2011-10-04 VITALS — Ht 66.0 in | Wt 178.0 lb

## 2011-10-04 DIAGNOSIS — Z1211 Encounter for screening for malignant neoplasm of colon: Secondary | ICD-10-CM

## 2011-10-04 MED ORDER — PEG-KCL-NACL-NASULF-NA ASC-C 100 G PO SOLR: ORAL | Status: DC

## 2011-10-17 ENCOUNTER — Encounter: Payer: Self-pay | Admitting: Internal Medicine

## 2011-10-17 ENCOUNTER — Ambulatory Visit (AMBULATORY_SURGERY_CENTER): Payer: 59 | Admitting: Internal Medicine

## 2011-10-17 VITALS — BP 104/67 | HR 83 | Temp 97.8°F | Resp 17 | Ht 66.0 in | Wt 178.0 lb

## 2011-10-17 DIAGNOSIS — Z1211 Encounter for screening for malignant neoplasm of colon: Secondary | ICD-10-CM

## 2011-10-17 DIAGNOSIS — D126 Benign neoplasm of colon, unspecified: Secondary | ICD-10-CM

## 2011-10-17 MED ORDER — SODIUM CHLORIDE 0.9 % IV SOLN: 500.0000 mL | INTRAVENOUS | Status: DC

## 2011-10-17 NOTE — Patient Instructions (Signed)
YOU HAD AN ENDOSCOPIC PROCEDURE TODAY AT THE [facility]: Refer to the procedure report that was given to you for any specific questions about what was found during the examination.  If the procedure report does not answer your questions, please call your gastroenterologist to clarify.  If you requested that your care partner not be given the details of your procedure findings, then the procedure report has been included in a sealed envelope for you to review at your convenience later.  YOU SHOULD EXPECT: Some feelings of bloating in the abdomen. Passage of more gas than usual.  Walking can help get rid of the air that was put into your GI tract during the procedure and reduce the bloating. If you had a lower endoscopy (such as a colonoscopy or flexible sigmoidoscopy) you may notice spotting of blood in your stool or on the toilet paper. If you underwent a bowel prep for your procedure, then you may not have a normal bowel movement for a few days.  DIET: Your first meal following the procedure should be a light meal and then it is ok to progress to your normal diet.  A half-sandwich or bowl of soup is an example of a good first meal.  Heavy or fried foods are harder to digest and may make you feel nauseous or bloated.  Likewise meals heavy in dairy and vegetables can cause extra gas to form and this can also increase the bloating.  Drink plenty of fluids but you should avoid alcoholic beverages for 24 hours.  ACTIVITY: Your care partner should take you home directly after the procedure.  You should plan to take it easy, moving slowly for the rest of the day.  You can resume normal activity the day after the procedure however you should NOT DRIVE or use heavy machinery for 24 hours (because of the sedation medicines used during the test).    SYMPTOMS TO REPORT IMMEDIATELY: A gastroenterologist can be reached at any hour.  During normal business hours, 8:30 AM to 5:00 PM Monday through Friday,  call [phone].  After hours and on weekends, please call the GI answering service at [phone] who will take a message and have the physician on call contact you.   Following lower endoscopy (colonoscopy or flexible sigmoidoscopy):  Excessive amounts of blood in the stool  Significant tenderness or worsening of abdominal pains  Swelling of the abdomen that is new, acute  Fever of 100F or higher    FOLLOW UP: If any biopsies were taken you will be contacted by phone or by letter within the next 1-3 weeks.  Call your gastroenterologist if you have not heard about the biopsies in 3 weeks.  Our staff will call the home number listed on your records the next business day following your procedure to check on you and address any questions or concerns that you may have at that time regarding the information given to you following your procedure. This is a courtesy call and so if there is no answer at the home number and we have not heard from you through the emergency physician on call, we will assume that you have returned to your regular daily activities without incident.  SIGNATURES/CONFIDENTIALITY: You and/or your care partner have signed paperwork which will be entered into your electronic medical record.  These signatures attest to the fact that that the information above on your After Visit Summary has been reviewed and is understood.  Full responsibility of the confidentiality   of this discharge information lies with you and/or your care-partner.     

## 2011-10-17 NOTE — Progress Notes (Signed)
Pressure applied to abdomen to reach cecum.  

## 2011-10-17 NOTE — Progress Notes (Addendum)
Wheel broke on scope, had to withdraw and restart procedure again. Insertion time 6:10, clock restarted

## 2011-10-17 NOTE — Op Note (Signed)
[facility] [address] [place], [place]  [address]  COLONOSCOPY PROCEDURE REPORT  PATIENT:  Claudia Warner, Claudia Warner  MR#:  [id number] BIRTHDATE:  07-05-1960, 50 yrs. old  GENDER:  female ENDOSCOPIST:  Iva Boop, MD, [facility]  PROCEDURE DATE:  10/17/2011 PROCEDURE:  Colonoscopy with biopsy ASA CLASS:  Class II INDICATIONS:  Routine Risk Screening MEDICATIONS:   These medications were titrated to patient response per physician's verbal order, Versed 7 mg IV, Fentanyl 75 mcg IV  DESCRIPTION OF PROCEDURE:   After the risks benefits and alternatives of the procedure were thoroughly explained, informed consent was obtained.  Digital rectal exam was performed and revealed no abnormalities.   The LB [id number] endoscope was introduced through the anus and advanced to the cecum, which was identified by both the appendix and ileocecal valve, without limitations.  The quality of the prep was excellent, using MoviPrep.  The instrument was then slowly withdrawn as the colon was fully examined. <<PROCEDUREIMAGES>>  FINDINGS:  A diminutive polyp was found in the cecum. It was 1 - 2 mm in size. The polyp was removed using cold biopsy forceps.  This was otherwise a normal examination of the colon.   Retroflexed views in the rectum revealed no abnormalities.    The time to cecum = 4:52 minutes. The scope was then withdrawn in 17:15 minutes from the cecum and the procedure completed. COMPLICATIONS:  None ENDOSCOPIC IMPRESSION: 1) 1 - 2 mm diminutive polyp in the cecum - removed 2) Otherwise normal examination  REPEAT EXAM:  In for Colonoscopy, pending biopsy results.  Iva Boop, MD, [facility]  CC:  Michele Mcalpine, MD and The Patient  n. eSIGNED:   Iva Boop at 10/17/2011 04:56 PM  Consuello Bossier, [phone]

## 2011-10-17 NOTE — Progress Notes (Signed)
Patient did not experience any of the following events: a burn prior to discharge; a fall within the facility; wrong site/side/patient/procedure/implant event; or a hospital transfer or hospital admission upon discharge from the facility. (G8907) Patient did not have preoperative order for IV antibiotic SSI prophylaxis. (G8918)  

## 2011-10-18 ENCOUNTER — Telehealth: Payer: Self-pay | Admitting: *Deleted

## 2011-10-18 NOTE — Telephone Encounter (Signed)
  Follow up Call-  Call back number 10/17/2011  Post procedure Call Back phone  # [phone]  Permission to leave phone message Yes     Patient questions:  Do you have a fever, pain , or abdominal swelling? no Pain Score  0 *  Have you tolerated food without any problems? yes  Have you been able to return to your normal activities? yes  Do you have any questions about your discharge instructions: Diet   no Medications  no Follow up visit  no  Do you have questions or concerns about your Care? no  Actions: * If pain score is 4 or above: No action needed, pain <4.

## 2011-10-23 ENCOUNTER — Encounter: Payer: Self-pay | Admitting: Internal Medicine

## 2011-10-23 DIAGNOSIS — Z860100 Personal history of colon polyps, unspecified: Secondary | ICD-10-CM

## 2011-10-23 HISTORY — DX: Personal history of colonic polyps: Z86.010

## 2011-10-23 HISTORY — DX: Personal history of colon polyps, unspecified: Z86.0100

## 2011-10-23 NOTE — Progress Notes (Signed)
Quick Note:  Diminutive cecal adenoma Repeat colonoscopy about 10/2016 ______

## 2011-11-20 ENCOUNTER — Encounter: Payer: Self-pay | Admitting: *Deleted

## 2012-04-02 ENCOUNTER — Ambulatory Visit (INDEPENDENT_AMBULATORY_CARE_PROVIDER_SITE_OTHER): Payer: 59

## 2012-04-02 DIAGNOSIS — Z23 Encounter for immunization: Secondary | ICD-10-CM

## 2012-04-04 DIAGNOSIS — Z23 Encounter for immunization: Secondary | ICD-10-CM

## 2012-04-09 ENCOUNTER — Other Ambulatory Visit: Payer: Self-pay | Admitting: Pulmonary Disease

## 2012-06-04 ENCOUNTER — Encounter: Payer: Self-pay | Admitting: *Deleted

## 2012-06-05 ENCOUNTER — Other Ambulatory Visit: Payer: Self-pay | Admitting: Pulmonary Disease

## 2012-06-05 ENCOUNTER — Ambulatory Visit (INDEPENDENT_AMBULATORY_CARE_PROVIDER_SITE_OTHER): Payer: 59 | Admitting: Pulmonary Disease

## 2012-06-05 ENCOUNTER — Encounter: Payer: Self-pay | Admitting: Pulmonary Disease

## 2012-06-05 ENCOUNTER — Other Ambulatory Visit (INDEPENDENT_AMBULATORY_CARE_PROVIDER_SITE_OTHER): Payer: 59

## 2012-06-05 VITALS — BP 110/76 | HR 77 | Temp 97.3°F | Ht 66.0 in | Wt 180.6 lb

## 2012-06-05 DIAGNOSIS — R002 Palpitations: Secondary | ICD-10-CM

## 2012-06-05 DIAGNOSIS — K219 Gastro-esophageal reflux disease without esophagitis: Secondary | ICD-10-CM

## 2012-06-05 DIAGNOSIS — Z0000 Encounter for general adult medical examination without abnormal findings: Secondary | ICD-10-CM

## 2012-06-05 DIAGNOSIS — E785 Hyperlipidemia, unspecified: Secondary | ICD-10-CM

## 2012-06-05 DIAGNOSIS — Z86010 Personal history of colonic polyps: Secondary | ICD-10-CM

## 2012-06-05 DIAGNOSIS — F329 Major depressive disorder, single episode, unspecified: Secondary | ICD-10-CM

## 2012-06-05 DIAGNOSIS — J309 Allergic rhinitis, unspecified: Secondary | ICD-10-CM

## 2012-06-05 LAB — BASIC METABOLIC PANEL
CO2: 23 mEq/L (ref 19–32)
Calcium: 8.8 mg/dL (ref 8.4–10.5)
Chloride: 104 mEq/L (ref 96–112)
Potassium: 4.3 mEq/L (ref 3.5–5.1)
Sodium: 137 mEq/L (ref 135–145)

## 2012-06-05 LAB — CBC WITH DIFFERENTIAL/PLATELET
Basophils Absolute: 0 10*3/uL (ref 0.0–0.1)
Eosinophils Absolute: 0 10*3/uL (ref 0.0–0.7)
Hemoglobin: 13.9 g/dL (ref 12.0–15.0)
Lymphocytes Relative: 23.6 % (ref 12.0–46.0)
Lymphs Abs: 1.9 10*3/uL (ref 0.7–4.0)
MCHC: 34.1 g/dL (ref 30.0–36.0)
Neutro Abs: 5.6 10*3/uL (ref 1.4–7.7)
RDW: 13.1 % (ref 11.5–14.6)

## 2012-06-05 LAB — HEPATIC FUNCTION PANEL
AST: 19 U/L (ref 0–37)
Alkaline Phosphatase: 73 U/L (ref 39–117)
Bilirubin, Direct: 0.1 mg/dL (ref 0.0–0.3)
Total Protein: 7.6 g/dL (ref 6.0–8.3)

## 2012-06-05 LAB — LIPID PANEL
HDL: 59.6 mg/dL (ref >39.00)
Total CHOL/HDL Ratio: 3

## 2012-06-05 MED ORDER — OMEPRAZOLE 20 MG PO CPDR: 20.0000 mg | DELAYED_RELEASE_CAPSULE | Freq: Every day | ORAL | Status: DC

## 2012-06-05 NOTE — Patient Instructions (Addendum)
Today we updated your med list in our EPIC system...    Continue your current medications the same...    We refilled the meds you requested...  Today we did your follow up EKG & FASTING blood work...    We will contact you w/ the results when avail...  Call for any questions...  Let's continue our routine yearly check ups, but call anytime if needed for problems.Marland KitchenMarland Kitchen

## 2012-06-05 NOTE — Progress Notes (Signed)
Subjective:    Patient ID: Claudia Warner, female    DOB: 1960-11-28, 51 y.o.   MRN: [id number]  HPI 51 y/o WF here for a follow up visit and CPX... she is Claudia Warner daughter...  ~  March 18, 2009:  she has had some incr anxiety/ stress and developed palpit- went to ER 02/25/09 w/ neg eval... EKG- NAD;  CXR- NAD;  CT Angio- neg;  Labs- neg... pt was reassured & no meds given... prev eval & Rx by [name] for Psyche & prev on Wellbutrin & Lexapro but she weaned off all these 2/10 on her own... offered anxiolytic Rx vs return to see [name] & she states she will call Psyche for followup...  ~  May 25, 2010:  she is c/o tired alot & incr irritable, difficulty conc, emotional, "short fuse", HAs, etc... she never followed up w/ [name] as suggested last yr- we decided to Rx w/ Lexapro 10mg  samples & she will sched her own appt w/ psyche... states breathing is OK;  denies palpit as long as she avoid caffeine;  Chol is reasonable on diet Rx but not optimal (she doesn't want meds);  GERD controlled w/ PPI... we discussed Flu vaccine & TDAP today.  ~  June 05, 2011:  Yearly ROV & CPX> she is c/o sl prob w/ memory which is most likely stress related/ benign forgetfulness- I offered her a Neuro referral for further eval & she will decide;  She has followed up w/ [name] & is currently taking WellbutrinSR 150mg -3tabs daily & Lexapro 10mg /d...   ~  June 05, 2012:  Yearly ROV & CPX> Daylin has had a good yr overall & she feels that most of her symptoms were stress related... We reviewed the following problems today>>    AR, AB> she is off prev allergy shots & off flonase; she denies allergy symptoms, cough, sput, dyspnea, CP, etc...    Hx palpit> on ASA81; she is active but needs incr exercise program; denies CP, palpit, SOB, edema; she knows to avoid caffeine, pseudophed, etc...    Chol> on diet, CoQ10, KrillOil; FLP shows TChol 186, TG 97, HDL 60, LDL 107    GI- GERD, colon polyp> on Omep20; denies abd  pain, dysphagia, n/v, c/d, blood seen; she had f/u colon by [name] 4/13- neg x for 2mm cecal polyp removed (tubular adenoma & f/u planned 60yrs)...    GYN- Fibroids> on BCP per Gyn, they also write for Valtrex prn outbreaks...     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 She reports some sinusHAs & tells me she had a neg MRI Brain per [name]...    Anxiety, Depression> treated by [name] on WellbutrinER150Tid, Lexapro10-1/2 tab/d...  We reviewed prob list, meds, xrays and labs> see below for updates >> she had the 2013 Flu vaccine in Oct... EKG 12/13 showed NSR, rate71, wnl, NAD... LABS 12/13:  FLP- at goals w/ LDL=107;  Chems- wnl;  CBC- wnl;  TSH=1.30;  VitD=88  Problem List:    ALLERGIC RHINITIS (ICD-477.9) - prev on shots & Flonase per DrESL... pt stopped these on her own due to weight gain...  ASTHMATIC BRONCHITIS, ACUTE (ICD-466.0) - she had a URI/ exac in Jan09 treated by TParrett,NP and improved... ~  CTA Chest 9/10 was neg- no PE, clear lungs, NAD.Marland Kitchen. ~  CXR 12/12 showed normal heart size, clear lungs, WNL.Marland KitchenMarland Kitchen  Hx of PALPITATIONS (ICD-785.1) - hx occas palpit when stressed or from caffeine... ~  She notes persistent but rare palpit "beating hard & fast" on & off, lasts just seconds & occurs <1/mo...  HYPERCHOLESTEROLEMIA, BORDERLINE (ICD-272.4) - on diet + exercise... she walks regularly and golfs some... ~  FLP 1/08 showed TChol 173, TG 86, HDL 54, LDL 102 ~  FLP 3/09 showed TChol 175, TG 114, HDL 52, LDL 100 ~  FLP 9/10 showed TChol  181, TG 71, HDL 48, LDL 119 ~  FLP 11/11 showed TChol 205, TG 97, HDL 54, LDL 126... we discussed diet, exercise, etc. ~  FLP 12/12 showed TChol 187, TG 107, HDL 58, LDL 108... On CoQ10 & Krill Oil. ~  FLP 12/13 showed TChol 186, TG 97, HDL 60, LDL 107... Continue CoQ10 & KrillOil.  GERD (ICD-530.81) - she takes PRILOSEC 20mg /d...  COLONOSCOPY >> she had a routine screening colonoscopy in 2005 & rec to f/u in 10 yrs... ~  [name] did f/u colonoscopy 4/13- neg x for 2mm cecal polyp removed (tubular adenoma & f/u planned 57yrs)...  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   ANXIETY/ DEPRESSION (ICD-311) - Eval and Rx by [name]... prev on Wellbutrin XL and Lexapro- she weaned off meds on her own 2/10...  ~  9/10: pt went to ER w/ palpit & felt to be panic attack by EDP- she will ret to [name] for Rx (never did). ~  11/11:  we gave samples LEXAPRO 10mg  & she has f/u appt w/ [name] in several weeks. ~  12/13: she is followed by [name] on WellbutrinER150Tid, Lexapro10-1/2 tab/d...  PHYSICAL EXAMINATION (ICD-V70.0) >>  ~  GI per [name]- she had f/u colonoscopy 4/13- neg x for 2mm cecal polyp removed (tubular adenoma & f/u planned 67yrs)... ~  Her GYN is [name] & [name] on BCPs now and Valtrex 500mg ;  up-to-date on PAP, Mammogram, etc... she is on a BCP- calcium + vits... ~  Immuniz:  She gets the yearly Flu vaccine; She had TDAP 12/11...   Past Surgical History  Procedure Date  . Wisdom tooth extraction 1990  . Colonoscopy      Outpatient Encounter Prescriptions as of 06/05/2012  Medication Sig Dispense Refill  . aspirin 81 MG tablet Take 81 mg by mouth daily.         Marland Kitchen buPROPion (WELLBUTRIN SR) 150 MG 12 hr tablet Take 150 mg by mouth 3 (three) times daily.        . Calcium Carbonate-Vitamin D (CALTRATE 600+D) 600-400 MG-UNIT per tablet Take 1 tablet by mouth 2 (two) times daily.        . Coenzyme Q10 (COQ10) 100 MG CAPS Take 1 capsule by mouth daily.        Marland Kitchen  escitalopram (LEXAPRO) 10 MG tablet Take 1/2 tablet by mouth daily      . Krill Oil 300 MG CAPS Take 1 capsule by mouth daily.        . Multiple Vitamins-Minerals (CENTRUM PO) Take 1 tablet by mouth daily.        . norgestimate-ethinyl estradiol (ORTHO-CYCLEN,SPRINTEC,PREVIFEM) 0.25-35 MG-MCG tablet Take 1 tablet by mouth daily.        Marland Kitchen omeprazole (PRILOSEC) 20 MG capsule TAKE 1 CAPSULE DAILY 30 MINUTES BEFORE THE FIRST MEAL OF THE DAY  90 capsule  2  . valACYclovir (VALTREX) 1000 MG tablet Take as directed      . [DISCONTINUED] vitamin B-12 (CYANOCOBALAMIN) 1000 MCG tablet Take 1,000 mcg by mouth daily.          Allergies  Allergen Reactions  . Sudafed (Pseudoephedrine Hcl)     Heart races    Current Medications, Allergies, Past Medical History, Past Surgical History, Family History, and Social History were reviewed in [employer] record.   Review of Systems        The patient complains of fatigue, malaise, palpitations, dyspnea on exertion, depression, and hay fever.   The patient denies fever, chills, sweats, anorexia, weakness, weight loss, sleep disorder, blurring, diplopia, eye irritation, eye discharge, vision loss, eye pain, photophobia, earache, ear discharge, tinnitus, decreased hearing, nasal congestion, nosebleeds, sore throat, hoarseness, chest pain, syncope, orthopnea, PND, peripheral edema, cough, dyspnea at rest, excessive sputum, hemoptysis, wheezing, pleurisy, nausea, vomiting, diarrhea, constipation, change in bowel habits, abdominal pain, melena, hematochezia, jaundice, gas/bloating, indigestion/heartburn, dysphagia, odynophagia, dysuria, hematuria,  urinary frequency, urinary hesitancy, nocturia, incontinence, back pain, joint pain, joint swelling, muscle cramps, muscle weakness, stiffness, arthritis, sciatica, restless legs, leg pain at night, leg pain with exertion, rash, itching, dryness, suspicious lesions, paralysis, paresthesias, seizures, tremors, vertigo, transient blindness, frequent falls, frequent headaches, difficulty walking, anxiety, memory loss, confusion, cold intolerance, heat intolerance, polydipsia, polyphagia, polyuria, unusual weight change, abnormal bruising, bleeding, enlarged lymph nodes, urticaria, allergic rash, and recurrent infections.     Objective:   Physical Exam     WD, WN, 51 y/o WF in NAD... GENERAL:  Alert & oriented; pleasant & cooperative. HEENT:  [place]/AT, EOM-wnl, PERRLA, Fundi-benign, EACs-clear, TMs-wnl, NOSE-clear, THROAT-clear & wnl. NECK:  Supple w/ full ROM; no JVD; normal carotid impulses w/o bruits; no thyromegaly or nodules palpated; no lymphadenopathy. CHEST:  Clear to P & A; without wheezes/ rales/ or rhonchi. HEART:  Regular Rhythm; without murmurs/ rubs/ or gallops. ABDOMEN:  Soft & nontender; normal bowel sounds; no organomegaly or masses detected. EXT: without deformities or arthritic changes; no varicose veins/ venous insuffic/ or edema. NEURO:  CN's intact; motor testing normal; sensory testing normal; gait normal & balance OK. DERM:  No lesions noted; no rash etc...  RADIOLOGY DATA:  Reviewed in the EPIC EMR & discussed w/ the patient...  LABORATORY DATA:  Reviewed in the EPIC EMR & discussed w/ the patient...   Assessment & Plan:   CPX>>  AR/ AB>  Stable w/o infectious exac...  Hx Palpit>  Notes occas palpit but self limited & not progressive...  CHOL>  Improved on her diet, CoQ10, and Krill Oil...  GI> GERD, colon polyp>  Stable & screening colon 4/13 w/ 2mm cecal adenoma removed, f/u planned 84yrs...  Neuro eval- memory loss> nothing found neurologically & sleep eval  [name] as reported above...  Anxiety/ Depression>  Improved on Wellbutrin & Lexapro per [name]...   Patient's Medications  New Prescriptions  No medications on file  Previous Medications   ASPIRIN 81 MG TABLET    Take 81 mg by mouth daily.     BUPROPION (WELLBUTRIN SR) 150 MG 12 HR TABLET    Take 150 mg by mouth 3 (three) times daily.     CALCIUM CARBONATE-VITAMIN D (CALTRATE 600+D) 600-400 MG-UNIT PER TABLET    Take 1 tablet by mouth 2 (two) times daily.     COENZYME Q10 (COQ10) 100 MG CAPS    Take 1 capsule by mouth daily.     ESCITALOPRAM (LEXAPRO) 10 MG TABLET    Take 1/2 tablet by mouth daily   KRILL OIL 300 MG CAPS    Take 1 capsule by mouth daily.     MULTIPLE VITAMINS-MINERALS (CENTRUM PO)    Take 1 tablet by mouth daily.     NORGESTIMATE-ETHINYL ESTRADIOL (ORTHO-CYCLEN,SPRINTEC,PREVIFEM) 0.25-35 MG-MCG TABLET    Take 1 tablet by mouth daily.     VALACYCLOVIR (VALTREX) 1000 MG TABLET    Take as directed  Modified Medications   Modified Medication Previous Medication   OMEPRAZOLE (PRILOSEC) 20 MG CAPSULE omeprazole (PRILOSEC) 20 MG capsule      Take 1 capsule (20 mg total) by mouth daily. 30 minutes before the first meal of the day    TAKE 1 CAPSULE DAILY 30 MINUTES BEFORE THE FIRST MEAL OF THE DAY  Discontinued Medications   VITAMIN B-12 (CYANOCOBALAMIN) 1000 MCG TABLET    Take 1,000 mcg by mouth daily.

## 2012-06-11 ENCOUNTER — Encounter (HOSPITAL_BASED_OUTPATIENT_CLINIC_OR_DEPARTMENT_OTHER): Payer: Self-pay | Admitting: *Deleted

## 2012-06-11 ENCOUNTER — Emergency Department (HOSPITAL_BASED_OUTPATIENT_CLINIC_OR_DEPARTMENT_OTHER)
Admission: EM | Admit: 2012-06-11 | Discharge: 2012-06-11 | Disposition: A | Discharge status: Home / Self Care | Payer: 59 | Attending: Emergency Medicine | Admitting: Emergency Medicine

## 2012-06-11 DIAGNOSIS — K219 Gastro-esophageal reflux disease without esophagitis: Secondary | ICD-10-CM | POA: Insufficient documentation

## 2012-06-11 DIAGNOSIS — Z7982 Long term (current) use of aspirin: Secondary | ICD-10-CM | POA: Insufficient documentation

## 2012-06-11 DIAGNOSIS — J45909 Unspecified asthma, uncomplicated: Secondary | ICD-10-CM | POA: Insufficient documentation

## 2012-06-11 DIAGNOSIS — Z79899 Other long term (current) drug therapy: Secondary | ICD-10-CM | POA: Insufficient documentation

## 2012-06-11 DIAGNOSIS — J309 Allergic rhinitis, unspecified: Secondary | ICD-10-CM | POA: Insufficient documentation

## 2012-06-11 DIAGNOSIS — E7800 Pure hypercholesterolemia, unspecified: Secondary | ICD-10-CM | POA: Insufficient documentation

## 2012-06-11 DIAGNOSIS — F3289 Other specified depressive episodes: Secondary | ICD-10-CM | POA: Insufficient documentation

## 2012-06-11 DIAGNOSIS — Z87891 Personal history of nicotine dependence: Secondary | ICD-10-CM | POA: Insufficient documentation

## 2012-06-11 DIAGNOSIS — F329 Major depressive disorder, single episode, unspecified: Secondary | ICD-10-CM | POA: Insufficient documentation

## 2012-06-11 DIAGNOSIS — R509 Fever, unspecified: Secondary | ICD-10-CM | POA: Insufficient documentation

## 2012-06-11 DIAGNOSIS — IMO0001 Reserved for inherently not codable concepts without codable children: Secondary | ICD-10-CM | POA: Insufficient documentation

## 2012-06-11 DIAGNOSIS — R51 Headache: Secondary | ICD-10-CM | POA: Insufficient documentation

## 2012-06-11 MED ORDER — OSELTAMIVIR PHOSPHATE 75 MG PO CAPS
75.0000 mg | ORAL_CAPSULE | Freq: Once | ORAL | Status: AC
  Administered 2012-06-11: 75 mg via ORAL

## 2012-06-11 MED ORDER — OSELTAMIVIR PHOSPHATE 75 MG PO CAPS: 75.0000 mg | ORAL_CAPSULE | Freq: Two times a day (BID) | ORAL | Status: DC

## 2012-06-11 MED ORDER — OSELTAMIVIR PHOSPHATE 75 MG PO CAPS
ORAL_CAPSULE | ORAL | Status: AC
  Filled 2012-06-11: qty 1

## 2012-06-11 NOTE — ED Provider Notes (Signed)
History     CSN: [id number]  Arrival date & time 06/11/12  2118   First MD Initiated Contact with Patient 06/11/12 2158      Chief Complaint  Patient presents with  . Influenza    (Consider location/radiation/quality/duration/timing/severity/associated sxs/prior treatment) HPI Patient with headache, myalgias, and fever tonight to 99.7.  Patient denies uri symptom or cough.  Patient has had flu shot.  She has poor appetite and nausea, fluids ok.  No diarrhea.  No sick contacts in family and does not work outside home.  PMD Dr. Kriste Basque  Past Medical History  Diagnosis Date  . Allergic rhinitis   . Asthmatic bronchitis   . Palpitations   . Hypercholesteremia   . GERD (gastroesophageal reflux disease)   . Depression   . Uterine fibroid     Past Surgical History  Procedure Date  . Wisdom tooth extraction 1990  . Colonoscopy     Family History  Problem Relation Age of Onset  . Colon cancer Maternal Aunt 60  . Stomach cancer Neg Hx     History  Substance Use Topics  . Smoking status: Former Smoker    Types: Cigarettes    Quit date: 06/25/1978  . Smokeless tobacco: Never Used  . Alcohol Use: No    OB History    Grav Para Term Preterm Abortions TAB SAB Ect Mult Living                  Review of Systems  Constitutional: Positive for fever. Negative for chills, activity change, appetite change and unexpected weight change.  HENT: Negative for sore throat, rhinorrhea, neck pain, neck stiffness and sinus pressure.   Eyes: Negative for visual disturbance.  Respiratory: Negative for cough and shortness of breath.   Cardiovascular: Negative for chest pain and leg swelling.  Gastrointestinal: Negative for vomiting, abdominal pain, diarrhea and blood in stool.  Genitourinary: Negative for dysuria, urgency, frequency, vaginal discharge and difficulty urinating.  Musculoskeletal: Negative for myalgias, arthralgias and gait problem.  Skin: Negative for color change and rash.   Neurological: Positive for headaches. Negative for weakness and light-headedness.  Hematological: Does not bruise/bleed easily.  Psychiatric/Behavioral: Negative for dysphoric mood.    Allergies  Sudafed  Home Medications   Current Outpatient Rx  Name  Route  Sig  Dispense  Refill  . ASPIRIN 81 MG PO TABS   Oral   Take 81 mg by mouth daily.           . BUPROPION HCL ER (SR) 150 MG PO TB12   Oral   Take 150 mg by mouth 3 (three) times daily.           Marland Kitchen CALCIUM CARBONATE-VITAMIN D 600-400 MG-UNIT PO TABS   Oral   Take 1 tablet by mouth 2 (two) times daily.           . COQ10 100 MG PO CAPS   Oral   Take 1 capsule by mouth daily.           Marland Kitchen ESCITALOPRAM OXALATE 10 MG PO TABS      Take 1/2 tablet by mouth daily         . KRILL OIL 300 MG PO CAPS   Oral   Take 1 capsule by mouth daily.           . CENTRUM PO   Oral   Take 1 tablet by mouth daily.           Marland Kitchen NORGESTIMATE-ETH  ESTRADIOL 0.25-35 MG-MCG PO TABS   Oral   Take 1 tablet by mouth daily.           Marland Kitchen OMEPRAZOLE 20 MG PO CPDR   Oral   Take 1 capsule (20 mg total) by mouth daily. 30 minutes before the first meal of the day   90 capsule   3   . VALACYCLOVIR HCL 1 G PO TABS      Take as directed           BP 118/73  Pulse 96  Temp 98.9 F (37.2 C) (Oral)  Resp 18  SpO2 99%  LMP 06/11/2012  Physical Exam  Nursing note and vitals reviewed. Constitutional: She appears well-developed and well-nourished.  HENT:  Head: Normocephalic and atraumatic.  Eyes: Conjunctivae normal and EOM are normal. Pupils are equal, round, and reactive to light.  Neck: Normal range of motion. Neck supple.  Cardiovascular: Normal rate, regular rhythm, normal heart sounds and intact distal pulses.   Pulmonary/Chest: Effort normal and breath sounds normal.  Abdominal: Soft. Bowel sounds are normal.  Musculoskeletal: Normal range of motion.  Neurological: She is alert.  Skin: Skin is warm and dry.   Psychiatric: She has a normal mood and affect. Thought content normal.    ED Course  Procedures (including critical care time)  Labs Reviewed - No data to display No results found.   No diagnosis found.    MDM   Influenza illness. Discussed the risk benefit use of Tamiflu. Patient was to start Tamiflu at this time and is given a dose here in the department and a prescription.    Hilario Quarry, MD 06/12/12 2028

## 2012-06-11 NOTE — ED Notes (Addendum)
Pt sts she woke up this am with a HA and throughout the day pt began having body aches and nausea. Pt sts she had a fever at home of 99.7. Pt took two tylenol at 1830 hrs.

## 2013-04-30 ENCOUNTER — Other Ambulatory Visit: Payer: Self-pay

## 2013-06-03 ENCOUNTER — Ambulatory Visit (INDEPENDENT_AMBULATORY_CARE_PROVIDER_SITE_OTHER)
Admission: RE | Admit: 2013-06-03 | Discharge: 2013-06-03 | Disposition: A | Discharge status: Home / Self Care | Payer: 59 | Source: Ambulatory Visit | Attending: Pulmonary Disease | Admitting: Pulmonary Disease

## 2013-06-03 ENCOUNTER — Encounter: Payer: Self-pay | Admitting: Pulmonary Disease

## 2013-06-03 ENCOUNTER — Other Ambulatory Visit (INDEPENDENT_AMBULATORY_CARE_PROVIDER_SITE_OTHER): Payer: 59

## 2013-06-03 ENCOUNTER — Ambulatory Visit (INDEPENDENT_AMBULATORY_CARE_PROVIDER_SITE_OTHER): Payer: 59 | Admitting: Pulmonary Disease

## 2013-06-03 VITALS — BP 114/70 | HR 76 | Temp 98.3°F | Ht 66.0 in | Wt 192.6 lb

## 2013-06-03 DIAGNOSIS — G478 Other sleep disorders: Secondary | ICD-10-CM

## 2013-06-03 DIAGNOSIS — R0609 Other forms of dyspnea: Secondary | ICD-10-CM

## 2013-06-03 DIAGNOSIS — J309 Allergic rhinitis, unspecified: Secondary | ICD-10-CM

## 2013-06-03 DIAGNOSIS — Z23 Encounter for immunization: Secondary | ICD-10-CM

## 2013-06-03 DIAGNOSIS — Z0000 Encounter for general adult medical examination without abnormal findings: Secondary | ICD-10-CM

## 2013-06-03 DIAGNOSIS — R002 Palpitations: Secondary | ICD-10-CM

## 2013-06-03 DIAGNOSIS — K219 Gastro-esophageal reflux disease without esophagitis: Secondary | ICD-10-CM

## 2013-06-03 DIAGNOSIS — R0683 Snoring: Secondary | ICD-10-CM

## 2013-06-03 DIAGNOSIS — E785 Hyperlipidemia, unspecified: Secondary | ICD-10-CM

## 2013-06-03 DIAGNOSIS — Z860100 Personal history of colon polyps, unspecified: Secondary | ICD-10-CM

## 2013-06-03 DIAGNOSIS — F329 Major depressive disorder, single episode, unspecified: Secondary | ICD-10-CM

## 2013-06-03 DIAGNOSIS — F3289 Other specified depressive episodes: Secondary | ICD-10-CM

## 2013-06-03 LAB — BASIC METABOLIC PANEL
BUN: 14 mg/dL (ref 6–23)
Calcium: 9.2 mg/dL (ref 8.4–10.5)
Creatinine, Ser: 1.1 mg/dL (ref 0.4–1.2)
GFR: 56.03 mL/min — ABNORMAL LOW (ref >60.00)

## 2013-06-03 LAB — CBC WITH DIFFERENTIAL/PLATELET
Basophils Relative: 0.5 % (ref 0.0–3.0)
Eosinophils Relative: 1 % (ref 0.0–5.0)
HCT: 41.2 % (ref 36.0–46.0)
Lymphs Abs: 2 10*3/uL (ref 0.7–4.0)
MCV: 92.2 fl (ref 78.0–100.0)
Monocytes Absolute: 0.6 10*3/uL (ref 0.1–1.0)
Neutrophils Relative %: 59.2 % (ref 43.0–77.0)
RBC: 4.47 Mil/uL (ref 3.87–5.11)
WBC: 6.6 10*3/uL (ref 4.5–10.5)

## 2013-06-03 LAB — LIPID PANEL
Cholesterol: 211 mg/dL — ABNORMAL HIGH (ref 0–200)
HDL: 53.4 mg/dL (ref >39.00)
VLDL: 18 mg/dL (ref 0.0–40.0)

## 2013-06-03 LAB — LDL CHOLESTEROL, DIRECT: Direct LDL: 145.4 mg/dL

## 2013-06-03 LAB — HEPATIC FUNCTION PANEL: Total Bilirubin: 0.9 mg/dL (ref 0.3–1.2)

## 2013-06-03 MED ORDER — OMEPRAZOLE 20 MG PO CPDR: 20.0000 mg | DELAYED_RELEASE_CAPSULE | Freq: Every day | ORAL | Status: DC

## 2013-06-03 NOTE — Progress Notes (Signed)
Subjective:    Patient ID: Claudia Warner, female    DOB: [birth year], 52 y.o.   MRN: [id number]  HPI 52 y/o WF here for a follow up visit and CPX... she is Claudia Warner daughter...  ~  June 05, 2011:  Yearly ROV & CPX> she is c/o sl prob w/ memory which is most likely stress related/ benign forgetfulness- I offered her a Neuro referral for further eval & she will decide;  She has followed up w/ [name] & is currently taking WellbutrinSR 150mg -3tabs daily & Lexapro 10mg /d...   ~  June 05, 2012:  Yearly ROV & CPX> Claudia Warner has had a good yr overall & she feels that most of her symptoms were stress related... We reviewed the following problems today>>    AR, AB> she is off prev allergy shots & off flonase; she denies allergy symptoms, cough, sput, dyspnea, CP, etc...    Hx palpit> on ASA81; she is active but needs incr exercise program; denies CP, palpit, SOB, edema; she knows to avoid caffeine, pseudophed, etc...    Chol> on diet, CoQ10, KrillOil; FLP shows TChol 186, TG 97, HDL 60, LDL 107    GI- GERD, colon polyp> on Omep20; denies abd pain, dysphagia, n/v, c/d, blood seen; she had f/u colon by [name] 4/13- neg x for 2mm cecal polyp removed (tubular adenoma & f/u planned 50yrs)...    GYN- Fibroids> on BCP per Gyn, they also write for Valtrex prn outbreaks...     Ortho> she notes some intermittent pain in knees and legs; rec to use OTC Anti-inflamm Rx as needed; on calcium, MVI, VitD    Neuro eval> she was seen 2013 by [name] for eval memory loss, her MMSE was 30/30; no focal neuro deficits; hx depression w/ Rx from [name]; ?sleep apnea & she had sleep study by [name] 8/13- AHI=6 & RDI=14, O2 desat to 88%, no arrhythmias, felt to have prob upper airway resistance syndrome & loud snoring; Rec to start w/ exercise program & improved sleep hygiene... She reports some sinusHAs & tells me she had a neg MRI Brain per [name]...    Anxiety, Depression> treated by [name] on WellbutrinER150Tid,  Lexapro10-1/2 tab/d...  We reviewed prob list, meds, xrays and labs> see below for updates >> she had the 2013 Flu vaccine in Oct... EKG 12/13 showed NSR, rate71, wnl, NAD... LABS 12/13:  FLP- at goals w/ LDL=107;  Chems- wnl;  CBC- wnl;  TSH=1.30;  VitD=88   ~  June 03, 2013:  Yearly ROV & CPX> Haden reports a prob w/ hot flashes and is working w/ her Gyn about this; she is under a lot of stress w/ husb's depression (sees DrCottle) & her parents health issues- she continues under the care of [name] on same meds (see below); otherw she has had a good yr & we reviewed the following medical problems during today's office visit >>     UARS> she had Sleep study by DrDohemier 8/13 (ordered by [name])- AHI=6 & RDI=14, O2 desat to 88%, no arrhythmias, felt to have prob upper airway resistance syndrome & loud snoring; Rec to start w/ exercise program, improved sleep hygiene, get wt down...    AR, AB> she is off prev allergy shots & off Flonase; she denies allergy symptoms, cough, sput, dyspnea, CP, etc...    Hx palpit> on ASA81; she is active but needs incr exercise program; denies CP, palpit, SOB, edema; she knows to avoid caffeine, pseudophed, etc...    Chol> on diet,  CoQ10, KrillOil; she has gained 13#, FLP 12/14 shows TChol 211, TG 90, HDL 53, LDL 145; needs beeter diet, wt reduction or start meds- she will decide...    GI- GERD, colon polyp> on Omep20; denies abd pain, dysphagia, n/v, c/d, blood seen; she had f/u colon by [name] 4/13- neg x for 2mm cecal polyp removed (tubular adenoma & f/u planned 52yrs)...    GYN- Fibroids> off BCP per Gyn, now working on her hot flashes, they also write for Valtrex prn outbreaks...     Ortho> she notes some intermittent pain in knees and legs; rec to use OTC Anti-inflamm Rx as needed; on calcium, MVI, VitD    Neuro eval> she was seen 2013 by [name] for eval memory loss, her MMSE was 30/30; no focal neuro deficits; hx depression w/ Rx from [name]; ?sleep apnea & she  had sleep study by [name]- AHI=6 & RDI=14, O2 desat to 88%, no arrhythmias, felt to have prob upper airway resistance syndrome & loud snoring; Rec to start w/ exercise program & improved sleep hygiene... She reports some sinusHAs & tells me she had a neg MRI Brain per [name]...    Anxiety, Depression> treated by [name] on WellbutrinER150Tid, Lexapro10-1/2 tab/d and no changes over the past yr... We reviewed prob list, meds, xrays and labs> see below for updates >> given the 2014 Flu vaccine today... [facility]d [facility]...  CXR 12/14 showeed norm heart size, clear lungs, NAD... LABS 12/14:  FLP- not as good w/ LDL=145;  Chems- wnl;  CBC- wnl;  TSH=1.50...            Problem List:    UPPER AIRWAY RESISTANCE SYNDROME > she was seen 2013 by [name] for eval memory loss, her MMSE was 30/30; no focal neuro deficits; hx depression w/ Rx from [name]; ?sleep apnea & she had sleep study by [name] 8/13- AHI=6 & RDI=14, O2 desat to 88%, no arrhythmias, felt to have prob upper airway resistance syndrome & loud snoring; Rec to start w/ exercise program & improved sleep hygiene... ~  12/14: she has gained 13# up to 193# & we reviewed how this can impact her sleep, snoring, etc...  ALLERGIC RHINITIS (ICD-477.9) - prev on shots & Flonase per DrESL... pt stopped these on her own due to weight gain...  ASTHMATIC BRONCHITIS, ACUTE (ICD-466.0) - she had a URI/ exac in Jan09 treated by TParrett,NP and improved... ~  CTA Chest 9/10 was neg- no PE, clear lungs, NAD.Marland Kitchen. ~  CXR 12/12 showed normal heart size, clear lungs, WNL.Marland Kitchen. ~  CXR 12/14 showeed norm heart size, clear lungs, NAD...  Hx of PALPITATIONS (ICD-785.1) - hx occas palpit when stressed or from caffeine... ~  She notes persistent but rare palpit "beating hard & fast" on & off, lasts just seconds & occurs <1/mo... ~  EKG 12/13 showed NSR, rate71, wnl, NAD...  HYPERCHOLESTEROLEMIA, BORDERLINE (ICD-272.4) - on diet + exercise...  she walks regularly and golfs some... ~  FLP 1/08 showed TChol 173, TG 86, HDL 54, LDL 102 ~  FLP 3/09 showed TChol 175, TG 114, HDL 52, LDL 100 ~  FLP 9/10 showed TChol 181, TG 71, HDL 48, LDL 119 ~  FLP 11/11 showed TChol 205, TG 97, HDL 54, LDL 126... we discussed diet, exercise, etc. ~  FLP 12/12 showed TChol 187, TG 107, HDL 58, LDL 108... On CoQ10 & Krill Oil. ~  FLP 12/13 showed TChol 186, TG 97, HDL 60, LDL 107... Continue CoQ10 &  KrillOil. ~  FLP 12/14 on diet, CoQ10, KrillOil; she has gained 13#, Lab showed TChol 211, TG 90, HDL 53, LDL 145... Needs better diet & wt loss or start statin rx- she will decide.  GERD (ICD-530.81) - she takes PRILOSEC 20mg /d...  COLONOSCOPY >> she had a routine screening colonoscopy in 2005 & rec to f/u in 10 yrs... ~  [name] did f/u colonoscopy 4/13- neg x for 2mm cecal polyp removed (tubular adenoma & f/u planned 58yrs)...  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   ANXIETY/ DEPRESSION (ICD-311) - Eval and Rx by [name]... prev on Wellbutrin XL and Lexapro- she weaned off meds on her own 2/10...  ~  9/10: pt went to ER w/ palpit & felt to be panic attack by EDP- she will ret to [name] for Rx (never did). ~  11/11:  we gave samples LEXAPRO 10mg  & she has f/u appt w/ [name] in several weeks. ~  12/13: she is followed by [name] on WellbutrinER150Tid, Lexapro10-1/2 tab/d... ~  12/14:  treated by [name] on WellbutrinER150Tid, Lexapro10-1/2 tab/d and no changes over the past yr  PHYSICAL EXAMINATION (ICD-V70.0) >>  ~  GI per [name]- she had f/u colonoscopy 4/13- neg x for 2mm cecal polyp removed (tubular adenoma & f/u planned  68yrs)... ~  Her GYN is [name] & [name] off BCPs now and they are working on hot flashes;  up-to-date on PAP, Mammogram, etc... she is on a BCP- calcium + vits... ~  Immuniz:  She gets the yearly Flu vaccine; She had TDAP 12/11...   Past Surgical History  Procedure Laterality Date  . Wisdom tooth extraction  1990  . Colonoscopy       Outpatient Encounter Prescriptions as of 06/03/2013  Medication Sig  . aspirin 81 MG tablet Take 81 mg by mouth daily.    Marland Kitchen buPROPion (WELLBUTRIN SR) 150 MG 12 hr tablet Take 150 mg by mouth 3 (three) times daily.    . Calcium Carbonate-Vitamin D (CALTRATE 600+D) 600-400 MG-UNIT per tablet Take 1 tablet by mouth 2 (two) times daily.    . Coenzyme Q10 (COQ10) 100 MG CAPS Take 1 capsule by mouth daily.    Marland Kitchen escitalopram (LEXAPRO) 10 MG tablet Take 1/2 tablet by mouth daily  . estradiol-norethindrone (COMBIPATCH) 0.05-0.14 MG/DAY Place 1 patch onto the skin 2 (two) times a week.  Boris Lown Oil 300 MG CAPS Take 1 capsule by mouth daily.    . Multiple Vitamins-Minerals (CENTRUM PO) Take 1 tablet by mouth daily.    Marland Kitchen omeprazole (PRILOSEC) 20 MG capsule Take 1 capsule (20 mg total) by mouth daily. 30 minutes before the first meal of the day  . valACYclovir (VALTREX) 1000 MG tablet Take as directed  . [DISCONTINUED] norgestimate-ethinyl estradiol (ORTHO-CYCLEN,SPRINTEC,PREVIFEM) 0.25-35 MG-MCG tablet Take 1 tablet by mouth daily.    . [DISCONTINUED] oseltamivir (TAMIFLU) 75 MG capsule Take 1 capsule (75 mg total) by mouth every 12 (twelve) hours.  . [DISCONTINUED] oseltamivir (TAMIFLU) 75 MG capsule Take 1 capsule (75 mg total) by mouth every 12 (twelve) hours.    Allergies  Allergen Reactions  . Sudafed [Pseudoephedrine Hcl]  Heart races    Current Medications, Allergies, Past Medical History, Past Surgical History, Family History, and Social History were reviewed in [employer] record.   Review of Systems        The patient  complains of fatigue, malaise, palpitations, dyspnea on exertion, depression, and hay fever.   The patient denies fever, chills, sweats, anorexia, weakness, weight loss, sleep disorder, blurring, diplopia, eye irritation, eye discharge, vision loss, eye pain, photophobia, earache, ear discharge, tinnitus, decreased hearing, nasal congestion, nosebleeds, sore throat, hoarseness, chest pain, syncope, orthopnea, PND, peripheral edema, cough, dyspnea at rest, excessive sputum, hemoptysis, wheezing, pleurisy, nausea, vomiting, diarrhea, constipation, change in bowel habits, abdominal pain, melena, hematochezia, jaundice, gas/bloating, indigestion/heartburn, dysphagia, odynophagia, dysuria, hematuria, urinary frequency, urinary hesitancy, nocturia, incontinence, back pain, joint pain, joint swelling, muscle cramps, muscle weakness, stiffness, arthritis, sciatica, restless legs, leg pain at night, leg pain with exertion, rash, itching, dryness, suspicious lesions, paralysis, paresthesias, seizures, tremors, vertigo, transient blindness, frequent falls, frequent headaches, difficulty walking, anxiety, memory loss, confusion, cold intolerance, heat intolerance, polydipsia, polyphagia, polyuria, unusual weight change, abnormal bruising, bleeding, enlarged lymph nodes, urticaria, allergic rash, and recurrent infections.     Objective:   Physical Exam     WD, WN, 52 y/o WF in NAD... GENERAL:  Alert & oriented; pleasant & cooperative. HEENT:  [place]/AT, EOM-wnl, PERRLA, Fundi-benign, EACs-clear, TMs-wnl, NOSE-clear, THROAT-clear & wnl. NECK:  Supple w/ full ROM; no JVD; normal carotid impulses w/o bruits; no thyromegaly or nodules palpated; no lymphadenopathy. CHEST:  Clear to P & A; without wheezes/ rales/ or rhonchi. HEART:  Regular Rhythm; without murmurs/ rubs/ or gallops. ABDOMEN:  Soft & nontender; normal bowel sounds; no organomegaly or masses detected. EXT: without deformities or arthritic changes; no varicose  veins/ venous insuffic/ or edema. NEURO:  CN's intact; motor testing normal; sensory testing normal; gait normal & balance OK. DERM:  No lesions noted; no rash etc...  RADIOLOGY DATA:  Reviewed in the EPIC EMR & discussed w/ the patient...  LABORATORY DATA:  Reviewed in the EPIC EMR & discussed w/ the patient...   Assessment & Plan:   CPX>>  AR/ AB>  Stable w/o infectious exac...  Hx Palpit>  Notes occas palpit but self limited & not progressive...  CHOL>  Worse off her diet; on CoQ10, and Krill Oil, needs better diet & wt loss vs start meds- she will decide...  GI> GERD, colon polyp>  Stable & screening colon 4/13 w/ 2mm cecal adenoma removed, f/u planned 59yrs...  Neuro eval- memory loss> nothing found neurologically & sleep eval [name] as reported above...  Anxiety/ Depression>  Improved on Wellbutrin & Lexapro per [name]...   Patient's Medications  New Prescriptions   No medications on file  Previous Medications   ASPIRIN 81 MG TABLET    Take 81 mg by mouth daily.     BUPROPION (WELLBUTRIN SR) 150 MG 12 HR TABLET    Take 150 mg by mouth 3 (three) times daily.     CALCIUM CARBONATE-VITAMIN D (CALTRATE 600+D) 600-400 MG-UNIT PER TABLET    Take 1 tablet by mouth 2 (two) times daily.     COENZYME Q10 (COQ10) 100 MG CAPS    Take 1 capsule by mouth daily.     ESCITALOPRAM (LEXAPRO) 10 MG TABLET    Take 1/2 tablet by mouth daily   ESTRADIOL-NORETHINDRONE (COMBIPATCH) 0.05-0.14 MG/DAY    Place 1 patch onto the skin 2 (two) times a week.   KRILL OIL 300 MG CAPS  Take 1 capsule by mouth daily.     MULTIPLE VITAMINS-MINERALS (CENTRUM PO)    Take 1 tablet by mouth daily.     VALACYCLOVIR (VALTREX) 1000 MG TABLET    Take as directed  Modified Medications   Modified Medication Previous Medication   OMEPRAZOLE (PRILOSEC) 20 MG CAPSULE omeprazole (PRILOSEC) 20 MG capsule      Take 1 capsule (20 mg total) by mouth daily. 30 minutes before the first meal of the day    Take 1 capsule  (20 mg total) by mouth daily. 30 minutes before the first meal of the day  Discontinued Medications   NORGESTIMATE-ETHINYL ESTRADIOL (ORTHO-CYCLEN,SPRINTEC,PREVIFEM) 0.25-35 MG-MCG TABLET    Take 1 tablet by mouth daily.     OSELTAMIVIR (TAMIFLU) 75 MG CAPSULE    Take 1 capsule (75 mg total) by mouth every 12 (twelve) hours.   OSELTAMIVIR (TAMIFLU) 75 MG CAPSULE    Take 1 capsule (75 mg total) by mouth every 12 (twelve) hours.

## 2013-06-03 NOTE — Patient Instructions (Signed)
Today we updated your med list in our EPIC system...    Continue your current medications the same...    We refilled the meds you requested...  Today we did your follow up CXR & FASTING blood work...    We will contact you w/ the results when available...   We gave you the 2014 Flu vaccine today...  Let's get on track w/ our diet & exercise program...    The goal is to lose that 10-15 lbs...  Call for any questions...  Let's plan a follow up visit in 50yr, sooner if needed for problems.Marland KitchenMarland Kitchen

## 2013-07-23 ENCOUNTER — Telehealth: Payer: Self-pay | Admitting: Pulmonary Disease

## 2013-07-23 MED ORDER — PREDNISONE 10 MG PO TABS: ORAL_TABLET | ORAL | Status: DC

## 2013-07-23 MED ORDER — OSELTAMIVIR PHOSPHATE 75 MG PO CAPS: 75.0000 mg | ORAL_CAPSULE | Freq: Two times a day (BID) | ORAL | Status: DC

## 2013-07-23 NOTE — Telephone Encounter (Signed)
Called and spoke with pt. C/o prod cough w/ clear phlem, wheezing, chest tx, sore throat, nasal congestion, HA, chest congestion x Sunday. No fever, no chills, no sweats, no nausea, no vomiting, no body aches. She has been taking mucinex DM and OTC sinus and nasal decongestant. Please advise SN thanks Last OV 06/03/13 Pending 06/02/14 Allergies  Allergen Reactions  . Sudafed [Pseudoephedrine Hcl]     Heart races

## 2013-07-23 NOTE — Telephone Encounter (Signed)
Per CDY: call in tamiflu 75 mg 1 po BID x 5 days, pred 10 mg #20 8 day taper.  I called pt and made her aware of recs. She voiced her understanding and needed nothing further.

## 2013-09-08 ENCOUNTER — Telehealth: Payer: Self-pay | Admitting: Pulmonary Disease

## 2013-09-08 MED ORDER — OMEPRAZOLE 20 MG PO CPDR: 20.0000 mg | DELAYED_RELEASE_CAPSULE | Freq: Every day | ORAL | Status: DC

## 2013-09-08 NOTE — Telephone Encounter (Signed)
I called and made pt aware will send in RX.  Pt is aware SN is retiring from [facility]. She reports she has been told. She is in the process of finding a PCP.

## 2014-02-27 ENCOUNTER — Other Ambulatory Visit: Payer: Self-pay | Admitting: Pulmonary Disease

## 2014-03-12 ENCOUNTER — Encounter: Payer: Self-pay | Admitting: Internal Medicine

## 2014-03-30 ENCOUNTER — Other Ambulatory Visit: Payer: Self-pay | Admitting: Pulmonary Disease

## 2014-04-15 ENCOUNTER — Ambulatory Visit: Payer: 59

## 2014-04-22 ENCOUNTER — Ambulatory Visit (INDEPENDENT_AMBULATORY_CARE_PROVIDER_SITE_OTHER): Payer: 59

## 2014-04-22 DIAGNOSIS — Z23 Encounter for immunization: Secondary | ICD-10-CM

## 2014-06-02 ENCOUNTER — Ambulatory Visit: Payer: 59 | Admitting: Pulmonary Disease

## 2014-06-03 ENCOUNTER — Encounter: Payer: Self-pay | Admitting: Family Medicine

## 2014-06-03 ENCOUNTER — Ambulatory Visit (INDEPENDENT_AMBULATORY_CARE_PROVIDER_SITE_OTHER): Payer: 59 | Admitting: Family Medicine

## 2014-06-03 VITALS — BP 108/80 | HR 75 | Temp 98.0°F | Ht 66.0 in | Wt 192.3 lb

## 2014-06-03 DIAGNOSIS — R232 Flushing: Secondary | ICD-10-CM

## 2014-06-03 DIAGNOSIS — K5900 Constipation, unspecified: Secondary | ICD-10-CM

## 2014-06-03 DIAGNOSIS — A6000 Herpesviral infection of urogenital system, unspecified: Secondary | ICD-10-CM

## 2014-06-03 DIAGNOSIS — D219 Benign neoplasm of connective and other soft tissue, unspecified: Secondary | ICD-10-CM | POA: Insufficient documentation

## 2014-06-03 DIAGNOSIS — R739 Hyperglycemia, unspecified: Secondary | ICD-10-CM

## 2014-06-03 DIAGNOSIS — E785 Hyperlipidemia, unspecified: Secondary | ICD-10-CM

## 2014-06-03 DIAGNOSIS — N951 Menopausal and female climacteric states: Secondary | ICD-10-CM

## 2014-06-03 DIAGNOSIS — D259 Leiomyoma of uterus, unspecified: Secondary | ICD-10-CM

## 2014-06-03 DIAGNOSIS — Z1159 Encounter for screening for other viral diseases: Secondary | ICD-10-CM

## 2014-06-03 HISTORY — DX: Flushing: R23.2

## 2014-06-03 HISTORY — DX: Herpesviral infection of urogenital system, unspecified: A60.00

## 2014-06-03 LAB — CBC WITH DIFFERENTIAL/PLATELET
BASOS PCT: 0.6 % (ref 0.0–3.0)
Basophils Absolute: 0 10*3/uL (ref 0.0–0.1)
EOS PCT: 0.9 % (ref 0.0–5.0)
Eosinophils Absolute: 0.1 10*3/uL (ref 0.0–0.7)
HEMATOCRIT: 42.7 % (ref 36.0–46.0)
Hemoglobin: 14.2 g/dL (ref 12.0–15.0)
Lymphocytes Relative: 34.7 % (ref 12.0–46.0)
Lymphs Abs: 1.9 10*3/uL (ref 0.7–4.0)
MCHC: 33.2 g/dL (ref 30.0–36.0)
MCV: 93.1 fl (ref 78.0–100.0)
MONOS PCT: 8.3 % (ref 3.0–12.0)
Monocytes Absolute: 0.5 10*3/uL (ref 0.1–1.0)
NEUTROS PCT: 55.5 % (ref 43.0–77.0)
Neutro Abs: 3.1 10*3/uL (ref 1.4–7.7)
PLATELETS: 265 10*3/uL (ref 150.0–400.0)
RBC: 4.59 Mil/uL (ref 3.87–5.11)
RDW: 13.1 % (ref 11.5–15.5)
WBC: 5.6 10*3/uL (ref 4.0–10.5)

## 2014-06-03 LAB — LIPID PANEL
CHOL/HDL RATIO: 4
CHOLESTEROL: 195 mg/dL (ref 0–200)
HDL: 52.4 mg/dL (ref >39.00)
LDL CALC: 117 mg/dL — AB (ref 0–99)
NonHDL: 142.6
TRIGLYCERIDES: 129 mg/dL (ref 0.0–149.0)
VLDL: 25.8 mg/dL (ref 0.0–40.0)

## 2014-06-03 LAB — HEMOGLOBIN A1C: HEMOGLOBIN A1C: 5.5 % (ref 4.6–6.5)

## 2014-06-03 NOTE — Progress Notes (Signed)
Pre visit review using our clinic review tool, if applicable. No additional management support is needed unless otherwise documented below in the visit note. 

## 2014-06-03 NOTE — Patient Instructions (Addendum)
BEFORE YOU LEAVE: -schedule follow up in 1 month -labs   FOR the constipation: -daily fiber supplement -mirilax as needed -follow up in 1 month

## 2014-06-03 NOTE — Progress Notes (Signed)
HPI:  Claudia Warner is here to establish care.  Last PCP and physical: [facility], uterine fibroid and postmenopausal spotting. She takes minivelle and progesterone for hot flashes as well.  Has the following chronic problems that require follow up and concerns today:  Wants to check cholesterol (is FASTING) and hep c as saw sign for this screening.  Constipation: -mild, intermittent for about 4 months, some in the past as well -senna resolves this -has BM about every few days, clumps or log, mild straining at times -denies: nausea, vomiting, weight loss, hematochezia, melena, malaise, abd or pelvic pain -reports had colonsocopy 09/2011 with small adenomatous polyp and repeat advised in 5 years -also has uterine fibroids  GERD: -prilosec 20mg  daily -reports symptoms if stops PPI -denies: dysphagia, weight loss, vomiting  Genital herpes: -uses episodic tx rarely with valtrex  Hot Flashes: -treated with HRT with her gynecologist  Depression: -takes wellbutrin 150mg  once daily and lexapro 1- mg for about 5-7 days  -sees Dr. Toy Care -doing well currently per her report -denies hx hospitalization, SI or any thoughts of self harm   ROS negative for unless reported above: fevers, unintentional weight loss, hearing or vision loss, chest pain, palpitations, struggling to breath, hemoptysis, melena, hematochezia, hematuria, falls, loc, si, thoughts of self harm  Past Medical History  Diagnosis Date  . Allergic rhinitis   . Asthmatic bronchitis   . Palpitations   . Hypercholesteremia   . GERD (gastroesophageal reflux disease)   . Depression   . Uterine fibroid   . UARS (upper airway resistance syndrome) 06/03/2013  . ALLERGIC RHINITIS 09/23/2007    Qualifier: Diagnosis of  By: Lenna Gilford MD, Deborra Medina   . Personal history of colonic polyps-adenoma 10/23/2011    Diminutive cecal tubular adenoma 09/2011 Carlean Purl)   . HYPERCHOLESTEROLEMIA,  BORDERLINE 09/23/2007    Qualifier: Diagnosis of  By: Lenna Gilford MD, Deborra Medina   . DEPRESSION 09/23/2007    Qualifier: Diagnosis of  By: Lenna Gilford MD, Deborra Medina     Past Surgical History  Procedure Laterality Date  . Wisdom tooth extraction  1990  . Colonoscopy      Family History  Problem Relation Age of Onset  . Colon cancer Maternal Aunt 84  . Stomach cancer Neg Hx   . Heart disease Father     chf  . Cancer Sister     hodgkinlymphoma  . Heart disease Mother     a fib    History   Social History  . Marital Status: Married    Spouse Name: N/A    Number of Children: N/A  . Years of Education: N/A   Social History Main Topics  . Smoking status: Former Smoker -- 0.25 packs/day for 2 years    Types: Cigarettes    Quit date: 06/25/1978  . Smokeless tobacco: Never Used  . Alcohol Use: No  . Drug Use: No  . Sexual Activity: None   Other Topics Concern  . None   Social History Narrative   Work or School: lost her job 10 years ago, stays at home now      Home Situation: lives with husband      Spiritual Beliefs: Christian - [employer]      Lifestyle: walking 3 days per week; diet is ok          Current outpatient prescriptions: aspirin 81 MG tablet, Take 81 mg by mouth daily.  , Disp: ,  Rfl: ;  buPROPion (WELLBUTRIN SR) 150 MG 12 hr tablet, Take 150 mg by mouth 3 (three) times daily.  , Disp: , Rfl: ;  Calcium Carbonate-Vitamin D (CALTRATE 600+D) 600-400 MG-UNIT per tablet, Take 1 tablet by mouth 2 (two) times daily.  , Disp: , Rfl: ;  Coenzyme Q10 (COQ10) 100 MG CAPS, Take 1 capsule by mouth daily.  , Disp: , Rfl:  escitalopram (LEXAPRO) 10 MG tablet, Take 1/2 tablet by mouth daily, Disp: , Rfl: ;  Krill Oil 300 MG CAPS, Take 1 capsule by mouth daily.  , Disp: , Rfl: ;  MINIVELLE 0.1 MG/24HR patch, , Disp: , Rfl: ;  Multiple Vitamins-Minerals (CENTRUM PO), Take 1 tablet by mouth daily.  , Disp: , Rfl: ;  omeprazole (PRILOSEC) 20 MG capsule, TAKE 1 CAPSULE BY MOUTH ONCE EVERY DAY 30  MINS BEFORE FIRST MEAL OF THE DAY, Disp: 90 capsule, Rfl: 0 progesterone (PROMETRIUM) 100 MG capsule, Take 100 mg by mouth daily., Disp: , Rfl: ;  valACYclovir (VALTREX) 1000 MG tablet, Take as directed, Disp: , Rfl:   EXAM:  Filed Vitals:   06/03/14 0821  BP: 108/80  Pulse: 75  Temp: 98 F (36.7 C)    Body mass index is 31.05 kg/(m^2).  GENERAL: vitals reviewed and listed above, alert, oriented, appears well hydrated and in no acute distress  HEENT: atraumatic, conjunttiva clear, no obvious abnormalities on inspection of external nose and ears  NECK: no obvious masses on inspection  LUNGS: clear to auscultation bilaterally, no wheezes, rales or rhonchi, good air movement  ABD: BS+, soft, NTTP  CV: HRRR, no peripheral edema  MS: moves all extremities without noticeable abnormality  PSYCH: pleasant and cooperative, no obvious depression or anxiety  ASSESSMENT AND PLAN:  Discussed the following assessment and plan:  Genital herpes -cont episodic tx  Hot flashes -managed by her gynecologist  Uterine leiomyoma, unspecified location -managed by her gynecologist, advised she discuss her constipation with gyn to see if they feel this could be contributing to her constipation  Constipation, unspecified constipation type - Plan: CBC with Differential -discussed causes, seems mild and intermittent -advised is sig, not improving with fiber supplement, persistent to notify GI given hx polyp though had colonoscopy fairly recently -query if fibroids contributing  Need for hepatitis C screening test - Plan: Hep C Antibody  Hyperlipemia - Plan: Lipid Panel  Hyperglycemia - Plan: Hemoglobin A1c   -We reviewed the PMH, PSH, FH, SH, Meds and Allergies. -We provided refills for any medications we will prescribe as needed. -We addressed current concerns per orders and patient instructions. -We have asked for records for pertinent exams, studies, vaccines and notes from previous  providers. -We have advised patient to follow up per instructions below.   -Patient advised to return or notify a doctor immediately if symptoms worsen or persist or new concerns arise.  Patient Instructions  BEFORE YOU LEAVE: -schedule follow up in 1 month -labs   FOR the constipation: -daily fiber supplement -mirilax as needed -follow up in 1 month     Lacreshia Bondarenko R.

## 2014-06-04 LAB — HEPATITIS C ANTIBODY: HCV AB: NEGATIVE

## 2014-07-06 ENCOUNTER — Ambulatory Visit (INDEPENDENT_AMBULATORY_CARE_PROVIDER_SITE_OTHER): Payer: BLUE CROSS/BLUE SHIELD | Admitting: Family Medicine

## 2014-07-06 ENCOUNTER — Encounter: Payer: Self-pay | Admitting: Family Medicine

## 2014-07-06 VITALS — BP 110/78 | HR 82 | Temp 98.1°F | Ht 65.0 in | Wt 191.3 lb

## 2014-07-06 DIAGNOSIS — E785 Hyperlipidemia, unspecified: Secondary | ICD-10-CM

## 2014-07-06 DIAGNOSIS — F329 Major depressive disorder, single episode, unspecified: Secondary | ICD-10-CM

## 2014-07-06 DIAGNOSIS — F32A Depression, unspecified: Secondary | ICD-10-CM

## 2014-07-06 DIAGNOSIS — K5900 Constipation, unspecified: Secondary | ICD-10-CM

## 2014-07-06 DIAGNOSIS — K219 Gastro-esophageal reflux disease without esophagitis: Secondary | ICD-10-CM

## 2014-07-06 DIAGNOSIS — N951 Menopausal and female climacteric states: Secondary | ICD-10-CM

## 2014-07-06 DIAGNOSIS — A6000 Herpesviral infection of urogenital system, unspecified: Secondary | ICD-10-CM

## 2014-07-06 DIAGNOSIS — R232 Flushing: Secondary | ICD-10-CM

## 2014-07-06 NOTE — Progress Notes (Signed)
Pre visit review using our clinic review tool, if applicable. No additional management support is needed unless otherwise documented below in the visit note. 

## 2014-07-06 NOTE — Progress Notes (Addendum)
HPI:  Mild HLD: -on recent labs -advised healthy diet and regular CV exercise -she is working on her diet and is getting some exercise  Constipation: -resolved, continues to use fiber supplement -denies: nausea, vomiting, weight loss, hematochezia, melena, malaise, abd or pelvic pain, diarrhea, constipation, bloating  GERD: -prilosec 20mg  daily -reports symptoms if stops PPI -denies: dysphagia, weight loss, vomiting  Genital herpes: -uses episodic tx rarely with valtrex  Hot Flashes/Fibroids/post menopausal bleeding: -treated with HRT with her gynecologist -has some post-menopausal bleeding -hx postmenopausal bleeding, somewhat worse lately  Depression: -takes wellbutrin 150mg  once daily and lexapro 10 mg for about 5-7 days  -sees Dr. Toy Care -doing well currently per her report -denies hx hospitalization, SI or any thoughts of self harm  ROS: See pertinent positives and negatives per HPI.  Past Medical History  Diagnosis Date  . Allergic rhinitis   . Asthmatic bronchitis   . Palpitations   . Hypercholesteremia   . GERD (gastroesophageal reflux disease)   . Depression   . Uterine fibroid   . UARS (upper airway resistance syndrome) 06/03/2013  . ALLERGIC RHINITIS 09/23/2007    Qualifier: Diagnosis of  By: Lenna Gilford MD, Deborra Medina   . Personal history of colonic polyps-adenoma 10/23/2011    Diminutive cecal tubular adenoma 09/2011 Carlean Purl)   . HYPERCHOLESTEROLEMIA, BORDERLINE 09/23/2007    Qualifier: Diagnosis of  By: Lenna Gilford MD, Deborra Medina   . DEPRESSION 09/23/2007    Qualifier: Diagnosis of  By: Lenna Gilford MD, Deborra Medina     Past Surgical History  Procedure Laterality Date  . Wisdom tooth extraction  1990  . Colonoscopy      Family History  Problem Relation Age of Onset  . Colon cancer Maternal Aunt 59  . Stomach cancer Neg Hx   . Heart disease Father     chf  . Cancer Sister     hodgkinlymphoma  . Heart disease Mother     a fib    History   Social History  . Marital  Status: Married    Spouse Name: N/A    Number of Children: N/A  . Years of Education: N/A   Social History Main Topics  . Smoking status: Former Smoker -- 0.25 packs/day for 2 years    Types: Cigarettes    Quit date: 06/25/1978  . Smokeless tobacco: Never Used  . Alcohol Use: No  . Drug Use: No  . Sexual Activity: None   Other Topics Concern  . None   Social History Narrative   Work or School: lost her job 10 years ago, stays at home now      Home Situation: lives with husband      Spiritual Beliefs: Christian - [employer]      Lifestyle: walking 3 days per week; diet is ok          Current outpatient prescriptions: aspirin 81 MG tablet, Take 81 mg by mouth daily.  , Disp: , Rfl: ;  buPROPion (WELLBUTRIN SR) 150 MG 12 hr tablet, Take 150 mg by mouth 3 (three) times daily.  , Disp: , Rfl: ;  Calcium Carbonate-Vitamin D (CALTRATE 600+D) 600-400 MG-UNIT per tablet, Take 1 tablet by mouth 2 (two) times daily.  , Disp: , Rfl: ;  Coenzyme Q10 (COQ10) 100 MG CAPS, Take 1 capsule by mouth daily.  , Disp: , Rfl:  escitalopram (LEXAPRO) 10 MG tablet, Take 1/2 tablet by mouth daily, Disp: , Rfl: ;  Krill Oil 300 MG CAPS, Take 1 capsule by  mouth daily.  , Disp: , Rfl: ;  MINIVELLE 0.1 MG/24HR patch, , Disp: , Rfl: ;  Multiple Vitamins-Minerals (CENTRUM PO), Take 1 tablet by mouth daily.  , Disp: , Rfl: ;  omeprazole (PRILOSEC) 20 MG capsule, TAKE 1 CAPSULE BY MOUTH ONCE EVERY DAY 30 MINS BEFORE FIRST MEAL OF THE DAY, Disp: 90 capsule, Rfl: 0 progesterone (PROMETRIUM) 100 MG capsule, Take 100 mg by mouth daily., Disp: , Rfl: ;  valACYclovir (VALTREX) 1000 MG tablet, Take as directed, Disp: , Rfl:   EXAM:  Filed Vitals:   07/06/14 1006  BP: 110/78  Pulse: 82  Temp: 98.1 F (36.7 C)    Body mass index is 31.83 kg/(m^2).  GENERAL: vitals reviewed and listed above, alert, oriented, appears well hydrated and in no acute distress  HEENT: atraumatic, conjunttiva clear, no obvious  abnormalities on inspection of external nose and ears  NECK: no obvious masses on inspection  LUNGS: clear to auscultation bilaterally, no wheezes, rales or rhonchi, good air movement  CV: HRRR, no peripheral edema  ABD: BS+, soft, NTTP  MS: moves all extremities without noticeable abnormality  PSYCH: pleasant and cooperative, no obvious depression or anxiety  ASSESSMENT AND PLAN:  Discussed the following assessment and plan:  Depression  Hyperlipemia  Constipation, unspecified constipation type  Hot flashes  Genital herpes  Gastroesophageal reflux disease, esophagitis presence not specified  -constipation completely resolved -advised lifestyle recs -follow up in 6 months -advised she contact her gyn to notify them of her persistent PMB, she has appointment with them per her report and also agrees to call she has known fibroids and is on HRT -Patient advised to return or notify a doctor immediately if symptoms worsen or persist or new concerns arise.  Patient Instructions  We recommend the following healthy lifestyle measures: - eat a healthy diet consisting of lots of vegetables, fruits, beans, nuts, seeds, healthy meats such as white chicken and fish and whole grains.  - avoid fried foods, fast food, processed foods, sodas, red meet and other fattening foods.  - get a least 150 minutes of aerobic exercise per week.       Colin Benton R.

## 2014-07-06 NOTE — Patient Instructions (Signed)
We recommend the following healthy lifestyle measures: - eat a healthy diet consisting of lots of vegetables, fruits, beans, nuts, seeds, healthy meats such as white chicken and fish and whole grains.  - avoid fried foods, fast food, processed foods, sodas, red meet and other fattening foods.  - get a least 150 minutes of aerobic exercise per week.     

## 2014-07-12 ENCOUNTER — Other Ambulatory Visit: Payer: Self-pay | Admitting: Pulmonary Disease

## 2014-07-22 ENCOUNTER — Telehealth: Payer: Self-pay | Admitting: Family Medicine

## 2014-07-22 NOTE — Telephone Encounter (Signed)
Pt request refill of the following: omeprazole (PRILOSEC) 20 MG capsule    Phamacy: [facility]

## 2014-07-23 MED ORDER — OMEPRAZOLE 20 MG PO CPDR: 20.0000 mg | DELAYED_RELEASE_CAPSULE | Freq: Every day | ORAL | Status: DC

## 2014-07-23 NOTE — Telephone Encounter (Signed)
Ok to send

## 2014-07-23 NOTE — Telephone Encounter (Signed)
Rx done. 

## 2014-08-03 ENCOUNTER — Other Ambulatory Visit: Payer: Self-pay | Admitting: Obstetrics & Gynecology

## 2014-08-26 ENCOUNTER — Other Ambulatory Visit: Payer: Self-pay | Admitting: Obstetrics and Gynecology

## 2014-08-26 LAB — HM MAMMOGRAPHY: HM MAMMO: NEGATIVE

## 2014-08-27 LAB — CYTOLOGY - PAP

## 2014-10-05 ENCOUNTER — Encounter: Payer: Self-pay | Admitting: Internal Medicine

## 2014-10-15 ENCOUNTER — Other Ambulatory Visit: Payer: Self-pay | Admitting: Family Medicine

## 2014-10-18 ENCOUNTER — Other Ambulatory Visit: Payer: Self-pay | Admitting: *Deleted

## 2014-10-18 MED ORDER — OMEPRAZOLE 20 MG PO CPDR: 20.0000 mg | DELAYED_RELEASE_CAPSULE | Freq: Every day | ORAL | Status: DC

## 2014-10-18 NOTE — Telephone Encounter (Signed)
Rx request for Omeprazole sent via [facility] for 90 days.

## 2014-11-12 ENCOUNTER — Encounter: Payer: Self-pay | Admitting: *Deleted

## 2014-12-06 ENCOUNTER — Ambulatory Visit (INDEPENDENT_AMBULATORY_CARE_PROVIDER_SITE_OTHER): Payer: BLUE CROSS/BLUE SHIELD | Admitting: Internal Medicine

## 2014-12-06 ENCOUNTER — Encounter: Payer: Self-pay | Admitting: Internal Medicine

## 2014-12-06 VITALS — BP 116/78 | HR 86 | Ht 65.25 in | Wt 195.0 lb

## 2014-12-06 DIAGNOSIS — Z86010 Personal history of colonic polyps: Secondary | ICD-10-CM

## 2014-12-06 DIAGNOSIS — R195 Other fecal abnormalities: Secondary | ICD-10-CM

## 2014-12-06 NOTE — Progress Notes (Signed)
  Referred by: Lucretia Kern, DO and Gerilyn Nestle HNP  Subjective:    Patient ID: Claudia Warner, female    DOB: 10/01/1960, 54 y.o.   MRN: [id number] Cc: heme + stool HPI The patient is here because guaiac cards were + - she had been having some spotting and ? Of passing blood. Juanda Chance [facility] evaluated her post-meopausal bleedin- neg endometrial bx,  and she reduced estrogens and spotting got better but had stool cards - heme + No obvious bowel sxs. Hx colonoscopy w/ adenoma 09/2011.  Medications, allergies, past medical history, past surgical history, family history and social history are reviewed and updated in the EMR.   Review of Systems + low back pain, allergies   Objective:   Physical Exam @BP  116/78 mmHg  Pulse 86  Ht 5' 5.25" (1.657 m)  Wt 195 lb (88.451 kg)  BMI 32.21 kg/m2  LMP 06/11/2012@  General:  NAD Eyes:   anicteric Lungs:  clear Heart:: S1S2 no rubs, murmurs or gallops Abdomen:  soft and nontender, BS+ Ext:   no edema, cyanosis or clubbing    Data Reviewed:  GYN notes and stool card results 2013 colonoscopy - single adenoma    Assessment & Plan:  Heme + stool - Plan: Ambulatory referral to Gastroenterology  Hx of adenomatous polyp of colon   Colonoscopy to evaluate heme + stool since > 3 yrs since last exam The risks and benefits as well as alternatives of endoscopic procedure(s) have been discussed and reviewed. All questions answered. The patient agrees to proceed.  Cc: Juanda Chance [facility] Colin Benton R., DO

## 2014-12-06 NOTE — Patient Instructions (Signed)

## 2014-12-17 ENCOUNTER — Encounter: Payer: Self-pay | Admitting: Family Medicine

## 2014-12-30 ENCOUNTER — Other Ambulatory Visit: Payer: Self-pay | Admitting: Family Medicine

## 2014-12-30 ENCOUNTER — Other Ambulatory Visit: Payer: Self-pay | Admitting: *Deleted

## 2014-12-30 ENCOUNTER — Encounter: Payer: Self-pay | Admitting: Family Medicine

## 2014-12-30 DIAGNOSIS — R899 Unspecified abnormal finding in specimens from other organs, systems and tissues: Secondary | ICD-10-CM

## 2014-12-30 NOTE — Telephone Encounter (Signed)
Claudia Warner, can you help her set up a lab appointment for a NON-fasting BMP. Thanks.

## 2015-01-03 ENCOUNTER — Other Ambulatory Visit (INDEPENDENT_AMBULATORY_CARE_PROVIDER_SITE_OTHER): Payer: BLUE CROSS/BLUE SHIELD

## 2015-01-03 DIAGNOSIS — R899 Unspecified abnormal finding in specimens from other organs, systems and tissues: Secondary | ICD-10-CM | POA: Diagnosis not present

## 2015-01-03 LAB — BASIC METABOLIC PANEL
BUN: 12 mg/dL (ref 6–23)
CHLORIDE: 102 meq/L (ref 96–112)
CO2: 27 meq/L (ref 19–32)
Calcium: 9.2 mg/dL (ref 8.4–10.5)
Creatinine, Ser: 0.94 mg/dL (ref 0.40–1.20)
GFR: 66.07 mL/min (ref >60.00)
Glucose, Bld: 80 mg/dL (ref 70–99)
POTASSIUM: 3.9 meq/L (ref 3.5–5.1)
SODIUM: 137 meq/L (ref 135–145)

## 2015-01-04 ENCOUNTER — Ambulatory Visit: Payer: BLUE CROSS/BLUE SHIELD | Admitting: Family Medicine

## 2015-01-11 ENCOUNTER — Ambulatory Visit (INDEPENDENT_AMBULATORY_CARE_PROVIDER_SITE_OTHER): Payer: BLUE CROSS/BLUE SHIELD | Admitting: Family Medicine

## 2015-01-11 ENCOUNTER — Encounter: Payer: Self-pay | Admitting: Family Medicine

## 2015-01-11 VITALS — BP 112/88 | HR 93 | Temp 98.4°F | Ht 65.25 in | Wt 196.3 lb

## 2015-01-11 DIAGNOSIS — F329 Major depressive disorder, single episode, unspecified: Secondary | ICD-10-CM | POA: Diagnosis not present

## 2015-01-11 DIAGNOSIS — E669 Obesity, unspecified: Secondary | ICD-10-CM | POA: Diagnosis not present

## 2015-01-11 DIAGNOSIS — F32A Depression, unspecified: Secondary | ICD-10-CM

## 2015-01-11 DIAGNOSIS — K219 Gastro-esophageal reflux disease without esophagitis: Secondary | ICD-10-CM | POA: Diagnosis not present

## 2015-01-11 DIAGNOSIS — E785 Hyperlipidemia, unspecified: Secondary | ICD-10-CM

## 2015-01-11 NOTE — Patient Instructions (Addendum)
BEFORE YOU LEAVE: -schedule Physical in 3 month  We recommend the following healthy lifestyle measures: - eat a healthy diet consisting of lots of vegetables, fruits, beans, nuts, seeds, healthy meats such as white chicken and fish and whole grains.  - avoid fried foods, fast food, processed foods, sodas, red meet and other fattening foods.  - get a least 150 minutes of aerobic exercise per week.    Food Choices for Gastroesophageal Reflux Disease When you have gastroesophageal reflux disease (GERD), the foods you eat and your eating habits are very important. Choosing the right foods can help ease the discomfort of GERD. WHAT GENERAL GUIDELINES DO I NEED TO FOLLOW?  Choose fruits, vegetables, whole grains, low-fat dairy products, and low-fat meat, fish, and poultry.  Limit fats such as oils, salad dressings, butter, nuts, and avocado.  Keep a food diary to identify foods that cause symptoms.  Avoid foods that cause reflux. These may be different for different people.  Eat frequent small meals instead of three large meals each day.  Eat your meals slowly, in a relaxed setting.  Limit fried foods.  Cook foods using methods other than frying.  Avoid drinking alcohol.  Avoid drinking large amounts of liquids with your meals.  Avoid bending over or lying down until 2-3 hours after eating. WHAT FOODS ARE NOT RECOMMENDED? The following are some foods and drinks that may worsen your symptoms: Vegetables Tomatoes. Tomato juice. Tomato and spaghetti sauce. Chili peppers. Onion and garlic. Horseradish. Fruits Oranges, grapefruit, and lemon (fruit and juice). Meats High-fat meats, fish, and poultry. This includes hot dogs, ribs, ham, sausage, salami, and bacon. Dairy Whole milk and chocolate milk. Sour cream. Cream. Butter. Ice cream. Cream cheese.  Beverages Coffee and tea, with or without caffeine. Carbonated beverages or energy drinks. Condiments Hot sauce. Barbecue sauce.   Sweets/Desserts Chocolate and cocoa. Donuts. Peppermint and spearmint. Fats and Oils High-fat foods, including [place] fries and potato chips. Other Vinegar. Strong spices, such as black pepper, white pepper, red pepper, cayenne, curry powder, cloves, ginger, and chili powder. The items listed above may not be a complete list of foods and beverages to avoid. Contact your dietitian for more information. Document Released: 06/11/2005 Document Revised: 06/16/2013 Document Reviewed: 04/15/2013 [facility] Patient Information 2015 [place], [place]. This information is not intended to replace advice given to you by your health care provider. Make sure you discuss any questions you have with your health care provider.

## 2015-01-11 NOTE — Progress Notes (Signed)
HPI:  Mild HLD: -on recent labs -advised healthy diet and regular CV exercise -she is working on her diet and is getting some exercise  GERD: -prilosec 20mg  daily -reports symptoms if stops PPI -denies: dysphagia, weight loss, vomiting  Genital herpes: -uses episodic tx rarely with valtrex  Hot Flashes/Fibroids/post menopausal bleeding: -treated with HRT with her gynecologist -seeing gyn for possible endometriosis  Depression: -takes wellbutrin 150mg  once daily and lexapro 10 mg for about 5-7 days  -sees Dr. Toy Care -doing well currently per her report -denies hx hospitalization, SI or any thoughts of self harm  ROS: See pertinent positives and negatives per HPI.  ROS: See pertinent positives and negatives per HPI.  Past Medical History  Diagnosis Date  . Allergic rhinitis   . Asthmatic bronchitis   . Palpitations   . Hypercholesteremia   . GERD (gastroesophageal reflux disease)   . Uterine fibroid   . UARS (upper airway resistance syndrome) 06/03/2013  . ALLERGIC RHINITIS 09/23/2007    Qualifier: Diagnosis of  By: Lenna Gilford MD, Deborra Medina   . Personal history of colonic polyps-adenoma 10/23/2011    Diminutive cecal tubular adenoma 09/2011 Carlean Purl)   . HYPERCHOLESTEROLEMIA, BORDERLINE 09/23/2007    Qualifier: Diagnosis of  By: Lenna Gilford MD, Deborra Medina   . DEPRESSION 09/23/2007    Qualifier: Diagnosis of  By: Lenna Gilford MD, Deborra Medina     Past Surgical History  Procedure Laterality Date  . Wisdom tooth extraction  1990  . Colonoscopy      Family History  Problem Relation Age of Onset  . Colon cancer Maternal Aunt 49  . Stomach cancer Neg Hx   . Heart disease Father     chf  . Hodgkin's lymphoma Sister   . Heart disease Mother     a fib  . Colon cancer Maternal Aunt   . Uterine cancer Maternal Aunt     History   Social History  . Marital Status: Married    Spouse Name: N/A  . Number of Children: N/A  . Years of Education: N/A   Occupational History  . Retired     Social History Main Topics  . Smoking status: Former Smoker -- 0.25 packs/day for 2 years    Types: Cigarettes    Quit date: 06/25/1978  . Smokeless tobacco: Never Used  . Alcohol Use: No  . Drug Use: No  . Sexual Activity: Not on file   Other Topics Concern  . None   Social History Narrative   Work or School: lost her job 10 years ago, stays at home now   No children      1 caffeine daily      Home Situation: lives with husband      Spiritual Beliefs: Christian - [employer]      Lifestyle: walking 3 days per week; diet is ok           Current outpatient prescriptions:  .  aspirin 81 MG tablet, Take 81 mg by mouth daily.  , Disp: , Rfl:  .  buPROPion (WELLBUTRIN SR) 150 MG 12 hr tablet, Take 150 mg by mouth 3 (three) times daily.  , Disp: , Rfl:  .  Calcium Carbonate-Vitamin D (CALTRATE 600+D) 600-400 MG-UNIT per tablet, Take 1 tablet by mouth 2 (two) times daily.  , Disp: , Rfl:  .  Coenzyme Q10 (COQ10) 100 MG CAPS, Take 1 capsule by mouth daily.  , Disp: , Rfl:  .  Cyanocobalamin (VITAMIN B-12) 2000 MCG TBCR, Take  2,500 mcg by mouth daily., Disp: , Rfl:  .  escitalopram (LEXAPRO) 10 MG tablet, Take 1/2 tablet by mouth daily, Disp: , Rfl:  .  glucosamine-chondroitin 500-400 MG tablet, Take 1 tablet by mouth 2 (two) times daily., Disp: , Rfl:  .  Krill Oil 300 MG CAPS, Take 1 capsule by mouth daily.  , Disp: , Rfl:  .  MINIVELLE 0.1 MG/24HR patch, Place 0.75 patches onto the skin 2 (two) times a week. , Disp: , Rfl:  .  Multiple Vitamins-Minerals (CENTRUM PO), Take 1 tablet by mouth daily.  , Disp: , Rfl:  .  omeprazole (PRILOSEC) 20 MG capsule, TAKE 1 CAPSULE DAILY, Disp: 90 capsule, Rfl: 0 .  Probiotic Product ([facility] PROBIOTIC) CAPS, Take by mouth. Pt unaware of strength. Once daily., Disp: , Rfl:  .  progesterone (PROMETRIUM) 100 MG capsule, Take 100 mg by mouth daily., Disp: , Rfl:  .  valACYclovir (VALTREX) 1000 MG tablet, Take as directed, Disp: , Rfl:    EXAM:  Filed Vitals:   01/11/15 1306  BP: 112/88  Pulse: 93  Temp: 98.4 F (36.9 C)    Body mass index is 32.43 kg/(m^2).  GENERAL: vitals reviewed and listed above, alert, oriented, appears well hydrated and in no acute distress  HEENT: atraumatic, conjunttiva clear, no obvious abnormalities on inspection of external nose and ears  NECK: no obvious masses on inspection  LUNGS: clear to auscultation bilaterally, no wheezes, rales or rhonchi, good air movement  CV: HRRR, no peripheral edema  MS: moves all extremities without noticeable abnormality  PSYCH: pleasant and cooperative, no obvious depression or anxiety  ASSESSMENT AND PLAN:  Discussed the following assessment and plan:  Hyperlipemia  Gastroesophageal reflux disease, esophagitis presence not specified  Depression  Obesity  -discussed healthy recs for her kidneys -healthy diet and regular exercise advise -diet for GERD and trial off PPI as tolerated -Patient advised to return or notify a doctor immediately if symptoms worsen or persist or new concerns arise.  Patient Instructions  BEFORE YOU LEAVE: -schedule Physical in 3 month  We recommend the following healthy lifestyle measures: - eat a healthy diet consisting of lots of vegetables, fruits, beans, nuts, seeds, healthy meats such as white chicken and fish and whole grains.  - avoid fried foods, fast food, processed foods, sodas, red meet and other fattening foods.  - get a least 150 minutes of aerobic exercise per week.    Food Choices for Gastroesophageal Reflux Disease When you have gastroesophageal reflux disease (GERD), the foods you eat and your eating habits are very important. Choosing the right foods can help ease the discomfort of GERD. WHAT GENERAL GUIDELINES DO I NEED TO FOLLOW?  Choose fruits, vegetables, whole grains, low-fat dairy products, and low-fat meat, fish, and poultry.  Limit fats such as oils, salad dressings, butter,  nuts, and avocado.  Keep a food diary to identify foods that cause symptoms.  Avoid foods that cause reflux. These may be different for different people.  Eat frequent small meals instead of three large meals each day.  Eat your meals slowly, in a relaxed setting.  Limit fried foods.  Cook foods using methods other than frying.  Avoid drinking alcohol.  Avoid drinking large amounts of liquids with your meals.  Avoid bending over or lying down until 2-3 hours after eating. WHAT FOODS ARE NOT RECOMMENDED? The following are some foods and drinks that may worsen your symptoms: Vegetables Tomatoes. Tomato juice. Tomato and spaghetti sauce. Chili  peppers. Onion and garlic. Horseradish. Fruits Oranges, grapefruit, and lemon (fruit and juice). Meats High-fat meats, fish, and poultry. This includes hot dogs, ribs, ham, sausage, salami, and bacon. Dairy Whole milk and chocolate milk. Sour cream. Cream. Butter. Ice cream. Cream cheese.  Beverages Coffee and tea, with or without caffeine. Carbonated beverages or energy drinks. Condiments Hot sauce. Barbecue sauce.  Sweets/Desserts Chocolate and cocoa. Donuts. Peppermint and spearmint. Fats and Oils High-fat foods, including [place] fries and potato chips. Other Vinegar. Strong spices, such as black pepper, white pepper, red pepper, cayenne, curry powder, cloves, ginger, and chili powder. The items listed above may not be a complete list of foods and beverages to avoid. Contact your dietitian for more information. Document Released: 06/11/2005 Document Revised: 06/16/2013 Document Reviewed: 04/15/2013 [facility] Patient Information 2015 [place], [place]. This information is not intended to replace advice given to you by your health care provider. Make sure you discuss any questions you have with your health care provider.      Colin Benton R.

## 2015-01-11 NOTE — Progress Notes (Signed)
Pre visit review using our clinic review tool, if applicable. No additional management support is needed unless otherwise documented below in the visit note. 

## 2015-02-08 ENCOUNTER — Encounter: Payer: BLUE CROSS/BLUE SHIELD | Admitting: Internal Medicine

## 2015-02-24 ENCOUNTER — Encounter: Payer: Self-pay | Admitting: Internal Medicine

## 2015-02-24 ENCOUNTER — Ambulatory Visit (AMBULATORY_SURGERY_CENTER): Payer: BLUE CROSS/BLUE SHIELD | Admitting: Internal Medicine

## 2015-02-24 VITALS — BP 108/59 | HR 74 | Temp 98.0°F | Resp 11 | Ht 65.25 in | Wt 196.0 lb

## 2015-02-24 DIAGNOSIS — Z86010 Personal history of colonic polyps: Secondary | ICD-10-CM | POA: Diagnosis not present

## 2015-02-24 DIAGNOSIS — R195 Other fecal abnormalities: Secondary | ICD-10-CM | POA: Diagnosis not present

## 2015-02-24 DIAGNOSIS — D128 Benign neoplasm of rectum: Secondary | ICD-10-CM | POA: Diagnosis not present

## 2015-02-24 DIAGNOSIS — D120 Benign neoplasm of cecum: Secondary | ICD-10-CM

## 2015-02-24 MED ORDER — SODIUM CHLORIDE 0.9 % IV SOLN: 500.0000 mL | INTRAVENOUS | Status: DC

## 2015-02-24 NOTE — Op Note (Signed)
[place]  [address]. [place], [id number]   COLONOSCOPY PROCEDURE REPORT  PATIENT: Claudia Warner, Claudia Warner  MR#: [id number] BIRTHDATE: June 04, 1961 , 52  yrs. old GENDER: female ENDOSCOPIST: Gatha Mayer, MD, [facility] PROCEDURE DATE:  02/24/2015 PROCEDURE:   Colonoscopy, surveillance , Colonoscopy with biopsy, and Colonoscopy with snare polypectomy Prior Negative Screening - Now for repeat screening.  N/A History of Adenoma - Now for follow-up colonoscopy & has been > or = to 3 yrs.  No.  It has been less than 3 yrs since last colonoscopy.  Other: See Comments  Polyps removed today? Yes ASA CLASS:   Class II INDICATIONS:Evaluation of unexplained GI bleeding and PH Colon Adenoma. MEDICATIONS: Propofol 200 mg IV and Monitored anesthesia care  DESCRIPTION OF PROCEDURE:   After the risks benefits and alternatives of the procedure were thoroughly explained, informed consent was obtained.  The digital rectal exam revealed no abnormalities of the rectum.   The LB [id number]  endoscope was introduced through the anus and advanced to the cecum, which was identified by both the appendix and ileocecal valve. No adverse events experienced.   The quality of the prep was excellent. (MiraLax was used)  The instrument was then slowly withdrawn as the colon was fully examined. Estimated blood loss is zero unless otherwise noted in this procedure report.      COLON FINDINGS: Three polypoid shaped sessile polyps ranging from 2 to 70mm in size were found at the cecum and in the rectum. Polypectomies were performed with cold forceps (cecum x1) and with a cold snare (other 2).  The resection was complete, the polyp tissue was completely retrieved and sent to histology.   The examination was otherwise normal.  Retroflexed views revealed no abnormalities. The time to cecum = 3.7 Withdrawal time = 10.8   The scope was withdrawn and the procedure completed. COMPLICATIONS: There were no  immediate complications.  ENDOSCOPIC IMPRESSION: 1.   Three sessile polyps ranging from 2 to 24mm in size were found at the cecum and in the rectum; polypectomies were performed with cold forceps and with a cold snare 2.   The examination was otherwise normal  RECOMMENDATIONS: Timing of repeat colonoscopy will be determined by pathology findings.  eSigned:  Gatha Mayer, MD, [facility] 02/24/2015 2:06 PM   cc: Juanda Chance, [facility] Patient

## 2015-02-24 NOTE — Patient Instructions (Addendum)
I found and removed 3 polyps today - they all look benign.  I will let you know pathology results and when to have another routine colonoscopy by mail.  I appreciate the opportunity to care for you. Gatha Mayer, MD, FACG  YOU HAD AN ENDOSCOPIC PROCEDURE TODAY AT [facility]:   Refer to the procedure report that was given to you for any specific questions about what was found during the examination.  If the procedure report does not answer your questions, please call your gastroenterologist to clarify.  If you requested that your care partner not be given the details of your procedure findings, then the procedure report has been included in a sealed envelope for you to review at your convenience later.  YOU SHOULD EXPECT: Some feelings of bloating in the abdomen. Passage of more gas than usual.  Walking can help get rid of the air that was put into your GI tract during the procedure and reduce the bloating. If you had a lower endoscopy (such as a colonoscopy or flexible sigmoidoscopy) you may notice spotting of blood in your stool or on the toilet paper. If you underwent a bowel prep for your procedure, you may not have a normal bowel movement for a few days.  Please Note:  You might notice some irritation and congestion in your nose or some drainage.  This is from the oxygen used during your procedure.  There is no need for concern and it should clear up in a day or so.  SYMPTOMS TO REPORT IMMEDIATELY:   Following lower endoscopy (colonoscopy or flexible sigmoidoscopy):  Excessive amounts of blood in the stool  Significant tenderness or worsening of abdominal pains  Swelling of the abdomen that is new, acute  Fever of 100F or higher    For urgent or emergent issues, a gastroenterologist can be reached at any hour by calling [phone].   DIET: Your first meal following the procedure should be a small meal and then it is ok to progress to your normal diet.  Heavy or fried foods are harder to digest and may make you feel nauseous or bloated.  Likewise, meals heavy in dairy and vegetables can increase bloating.  Drink plenty of fluids but you should avoid alcoholic beverages for 24 hours.  ACTIVITY:  You should plan to take it easy for the rest of today and you should NOT DRIVE or use heavy machinery until tomorrow (because of the sedation medicines used during the test).    FOLLOW UP: Our staff will call the number listed on your records the next business day following your procedure to check on you and address any questions or concerns that you may have regarding the information given to you following your procedure. If we do not reach you, we will leave a message.  However, if you are feeling well and you are not experiencing any problems, there is no need to return our call.  We will assume that you have returned to your regular daily activities without incident.  If any biopsies were taken you will be contacted by phone or by letter within the next 1-3 weeks.  Please call us at [phone] if you have not heard about the biopsies in 3 weeks.    SIGNATURES/CONFIDENTIALITY: You and/or your care partner have signed paperwork which will be entered into your electronic medical record.  These signatures attest to the fact that that the information above on your After Visit Summary has  been reviewed and is understood.  Full responsibility of the confidentiality of this discharge information lies with you and/or your care-partner.    Information on polyps given to you today

## 2015-02-24 NOTE — Progress Notes (Signed)
Transferred to recovery room. A/O x3, pleased with MAC.  VSS.  Report to Penny, RN. 

## 2015-02-24 NOTE — Progress Notes (Signed)
Called to room to assist during endoscopic procedure.  Patient ID and intended procedure confirmed with present staff. Received instructions for my participation in the procedure from the performing physician.  

## 2015-02-25 ENCOUNTER — Telehealth: Payer: Self-pay | Admitting: *Deleted

## 2015-02-25 NOTE — Telephone Encounter (Signed)
  Follow up Call-  Call back number 02/24/2015  Post procedure Call Back phone  # [phone]  Permission to leave phone message Yes     Patient questions:  Do you have a fever, pain , or abdominal swelling? No. Pain Score  0 *  Have you tolerated food without any problems? Yes.    Have you been able to return to your normal activities? Yes.    Do you have any questions about your discharge instructions: Diet   No. Medications  No. Follow up visit  No.  Do you have questions or concerns about your Care? No.  Actions: * If pain score is 4 or above: No action needed, pain <4.

## 2015-03-06 ENCOUNTER — Encounter: Payer: Self-pay | Admitting: Internal Medicine

## 2015-03-06 DIAGNOSIS — Z86010 Personal history of colonic polyps: Secondary | ICD-10-CM

## 2015-03-06 NOTE — Progress Notes (Signed)
Quick Note:  2 diminutive adenomas - repeat colonoscopy 2021 ______ 

## 2015-03-18 ENCOUNTER — Other Ambulatory Visit: Payer: Self-pay | Admitting: Family Medicine

## 2015-04-20 ENCOUNTER — Encounter: Payer: BLUE CROSS/BLUE SHIELD | Admitting: Family Medicine

## 2015-05-24 ENCOUNTER — Ambulatory Visit (INDEPENDENT_AMBULATORY_CARE_PROVIDER_SITE_OTHER): Payer: BLUE CROSS/BLUE SHIELD | Admitting: Family Medicine

## 2015-05-24 ENCOUNTER — Encounter: Payer: Self-pay | Admitting: Family Medicine

## 2015-05-24 VITALS — BP 110/70 | HR 82 | Temp 98.2°F | Ht 65.25 in | Wt 200.6 lb

## 2015-05-24 DIAGNOSIS — E785 Hyperlipidemia, unspecified: Secondary | ICD-10-CM | POA: Diagnosis not present

## 2015-05-24 DIAGNOSIS — Z0000 Encounter for general adult medical examination without abnormal findings: Secondary | ICD-10-CM | POA: Diagnosis not present

## 2015-05-24 DIAGNOSIS — E669 Obesity, unspecified: Secondary | ICD-10-CM

## 2015-05-24 DIAGNOSIS — F3342 Major depressive disorder, recurrent, in full remission: Secondary | ICD-10-CM | POA: Diagnosis not present

## 2015-05-24 DIAGNOSIS — Z23 Encounter for immunization: Secondary | ICD-10-CM | POA: Diagnosis not present

## 2015-05-24 LAB — LIPID PANEL
CHOLESTEROL: 201 mg/dL — AB (ref 0–200)
HDL: 45.2 mg/dL (ref >39.00)
LDL CALC: 127 mg/dL — AB (ref 0–99)
NonHDL: 155.33
TRIGLYCERIDES: 143 mg/dL (ref 0.0–149.0)
Total CHOL/HDL Ratio: 4
VLDL: 28.6 mg/dL (ref 0.0–40.0)

## 2015-05-24 LAB — HEMOGLOBIN A1C: HEMOGLOBIN A1C: 5.4 % (ref 4.6–6.5)

## 2015-05-24 NOTE — Progress Notes (Signed)
Pre visit review using our clinic review tool, if applicable. No additional management support is needed unless otherwise documented below in the visit note. 

## 2015-05-24 NOTE — Progress Notes (Signed)
HPI:  Here for CPE:  -Concerns and/or follow up today:   Mild HLD/Obesity: -advised healthy diet and regular CV exercise -she is working on her diet  - exercise not as good lately  GERD: -prilosec 67m daily -reports symptoms if stops PPI -denies: dysphagia, weight loss, vomiting  Genital herpes: -uses episodic tx rarely with valtrex  Hot Flashes/Fibroids/post menopausal bleeding: -treated with HRT with her gynecologist -seeing gyn for possible endometriosis -sees CJuanda Chanceannually for gyn physical  Depression: -takes wellbutrin 1569monce daily and lexapro 10 mg for about 5-7 days  -sees Dr. KaToy Caredoing well currently per her report -denies hx hospitalization, SI or any thoughts of self harm  -Diet: variety of foods, balance and well rounded, larger portion sizes  -Exercise: no regular exercise  -Taking folic acid, vitamin D or calcium: yes  -Diabetes and Dyslipidemia Screening: today  -Hx of HTN: no  -Vaccines: flu vaccine today  -sexual activity: yes, female partner, no new partners  -wants STI testing (Hep C if born 1920-65 no  -FH breast, colon or ovarian ca: see FH Last mammogram: done Last colon cancer screening: done   -Alcohol, Tobacco, drug use: see social history  Review of Systems - no fevers, unintentional weight loss, vision loss, hearing loss, chest pain, sob, hemoptysis, melena, hematochezia, hematuria, genital discharge, changing or concerning skin lesions, bleeding, bruising, loc, thoughts of self harm or SI  Past Medical History  Diagnosis Date  . Allergic rhinitis   . Palpitations   . Hypercholesteremia   . GERD (gastroesophageal reflux disease)   . Uterine fibroid   . UARS (upper airway resistance syndrome) 06/03/2013  . ALLERGIC RHINITIS 09/23/2007    Qualifier: Diagnosis of  By: NaLenna GilfordD, ScDeborra Medina . Personal history of colonic polyps-adenoma 10/23/2011    Diminutive cecal tubular adenoma 09/2011 (GCarlean Purl  .  HYPERCHOLESTEROLEMIA, BORDERLINE 09/23/2007    Qualifier: Diagnosis of  By: NaLenna GilfordD, ScDeborra Medina . DEPRESSION 09/23/2007    Qualifier: Diagnosis of  By: NaLenna GilfordD, ScDeborra Medina . Asthmatic bronchitis     Past Surgical History  Procedure Laterality Date  . Wisdom tooth extraction  1990  . Colonoscopy      Family History  Problem Relation Age of Onset  . Colon cancer Maternal Aunt [id number]. Stomach cancer Neg Hx   . Heart disease Father     chf  . Hodgkin's lymphoma Sister   . Heart disease Mother     a fib  . Colon cancer Maternal Aunt   . Uterine cancer Maternal Aunt     Social History   Social History  . Marital Status: Married    Spouse Name: N/A  . Number of Children: N/A  . Years of Education: N/A   Occupational History  . Retired    Social History Main Topics  . Smoking status: Former Smoker -- 0.25 packs/day for 2 years    Types: Cigarettes    Quit date: 06/25/1978  . Smokeless tobacco: Never Used  . Alcohol Use: No  . Drug Use: No  . Sexual Activity: Not Asked   Other Topics Concern  . None   Social History Narrative   Work or School: lost her job 10 years ago, stays at home now   No children      1 caffeine daily      Home Situation: lives with husband      Spiritual Beliefs: [place]  Lifestyle: walking 3 days per week; diet is ok           Current outpatient prescriptions:  .  aspirin 81 MG tablet, Take 81 mg by mouth daily.  , Disp: , Rfl:  .  buPROPion (WELLBUTRIN SR) 150 MG 12 hr tablet, Take 150 mg by mouth 3 (three) times daily.  , Disp: , Rfl:  .  Calcium Carbonate-Vitamin D (CALTRATE 600+D) 600-400 MG-UNIT per tablet, Take 1 tablet by mouth 2 (two) times daily.  , Disp: , Rfl:  .  Coenzyme Q10 (COQ10) 100 MG CAPS, Take 1 capsule by mouth daily.  , Disp: , Rfl:  .  Cyanocobalamin (VITAMIN B-12) 2000 MCG TBCR, Take 2,500 mcg by mouth daily., Disp: , Rfl:  .  escitalopram (LEXAPRO) 10 MG tablet, Take 1/2 tablet by mouth daily,  Disp: , Rfl:  .  glucosamine-chondroitin 500-400 MG tablet, Take 1 tablet by mouth 2 (two) times daily., Disp: , Rfl:  .  Krill Oil 300 MG CAPS, Take 1 capsule by mouth daily.  , Disp: , Rfl:  .  MINIVELLE 0.05 MG/24HR patch, , Disp: , Rfl:  .  Multiple Vitamins-Minerals (CENTRUM PO), Take 1 tablet by mouth daily.  , Disp: , Rfl:  .  omeprazole (PRILOSEC) 20 MG capsule, TAKE 1 CAPSULE DAILY, Disp: 90 capsule, Rfl: 0 .  Probiotic Product ([employer] PROBIOTIC) CAPS, Take by mouth. Pt unaware of strength. Once daily., Disp: , Rfl:  .  progesterone (PROMETRIUM) 100 MG capsule, Take 100 mg by mouth daily., Disp: , Rfl:  .  valACYclovir (VALTREX) 1000 MG tablet, Take as directed, Disp: , Rfl:   EXAM:  Filed Vitals:   05/24/15 1141  BP: 110/70  Pulse: 82  Temp: 98.2 F (36.8 C)   Body mass index is 33.14 kg/(m^2).  GENERAL: vitals reviewed and listed below, alert, oriented, appears well hydrated and in no acute distress  HEENT: head atraumatic, PERRLA, normal appearance of eyes, ears, nose and mouth. moist mucus membranes.  NECK: supple, no masses or lymphadenopathy  LUNGS: clear to auscultation bilaterally, no rales, rhonchi or wheeze  CV: HRRR, no peripheral edema or cyanosis, normal pedal pulses  BREAST: declined  ABDOMEN: bowel sounds normal, soft, non tender to palpation, no masses, no rebound or guarding  GU: declined  SKIN: no rash or abnormal lesions on exposed portions of skin, she declined an unclothed exam  MS: normal gait, moves all extremities normally  NEURO: CN II-XII grossly intact, normal muscle strength and sensation to light touch on extremities  PSYCH: normal affect, pleasant and cooperative  ASSESSMENT AND PLAN:  Discussed the following assessment and plan:  Visit for preventive health examination - Plan: Hemoglobin A1c, BMP with eGFR  Hyperlipemia - Plan: Lipid Panel  Recurrent major depressive disorder, in full remission ([place]) - sees  psychiatrist  Obesity   -Discussed and advised all [place] preventive services health task force level A and B recommendations for age, sex and risks.  -she sees gyn for breast and gyn health  -Advised at least 150 minutes of exercise per week and a healthy diet low in saturated fats and sweets and consisting of fresh fruits and vegetables, lean meats such as fish and white chicken and whole grains.  -FASTING labs, studies and vaccines per orders this encounter  Orders Placed This Encounter  Procedures  . Lipid Panel  . Hemoglobin A1c  . BMP with eGFR    Patient advised to return to clinic immediately if symptoms worsen  or persist or new concerns.  Patient Instructions  BEFORE YO ULEAVE: -flu shot -labs -follow up in 6 months  Vit D3 [phone] IU daily  Adequate dietary intake of calcium (1239m)  Please consider diet for acid reflux and trying to reduce or stop prilosec if you tolerate  We recommend the following healthy lifestyle measures: - eat a healthy whole foods diet consisting of regular small meals composed of vegetables, fruits, beans, nuts, seeds, healthy meats such as white chicken and fish and whole grains.  - avoid sweets, white starchy foods, fried foods, fast food, processed foods, sodas, red meet and other fattening foods.  - get a least 150-300 minutes of aerobic exercise per week.   -We have ordered labs or studies at this visit. It can take up to 1-2 weeks for results and processing. We will contact you with instructions IF your results are abnormal. Normal results will be [facility] your [facility] If you have not heard from [place] can not find your results [facility] weeks please contact our office.   Food Choices for Gastroesophageal Reflux Disease, Adult When you have gastroesophageal reflux disease (GERD), the foods you eat and your eating habits are very important. Choosing the right foods can help ease the discomfort of GERD. WHAT GENERAL GUIDELINES  DO I NEED TO FOLLOW?  Choose fruits, vegetables, whole grains, low-fat dairy products, and low-fat meat, fish, and poultry.  Limit fats such as oils, salad dressings, butter, nuts, and avocado.  Keep a food diary to identify foods that cause symptoms.  Avoid foods that cause reflux. These may be different for different people.  Eat frequent small meals instead of three large meals each day.  Eat your meals slowly, in a relaxed setting.  Limit fried foods.  Cook foods using methods other than frying.  Avoid drinking alcohol.  Avoid drinking large amounts of liquids with your meals.  Avoid bending over or lying down until 2-3 hours after eating. WHAT FOODS ARE NOT RECOMMENDED? The following are some foods and drinks that may worsen your symptoms: Vegetables Tomatoes. Tomato juice. Tomato and spaghetti sauce. Chili peppers. Onion and garlic. Horseradish. Fruits Oranges, grapefruit, and lemon (fruit and juice). Meats High-fat meats, fish, and poultry. This includes hot dogs, ribs, ham, sausage, salami, and bacon. Dairy Whole milk and chocolate milk. Sour cream. Cream. Butter. Ice cream. Cream cheese.  Beverages Coffee and tea, with or without caffeine. Carbonated beverages or energy drinks. Condiments Hot sauce. Barbecue sauce.  Sweets/Desserts Chocolate and cocoa. Donuts. Peppermint and spearmint. Fats and Oils High-fat foods, including [place] and potato chips. Other Vinegar. Strong spices, such as black pepper, white pepper, red pepper, cayenne, curry powder, cloves, ginger, and chili powder. The items listed above may not be a complete list of foods and beverages to avoid. Contact your dietitian for more information.   This information is not intended to replace advice given to you by your health care provider. Make sure you discuss any questions you have with your health care provider.   Document Released: 06/11/2005 Document Revised: 07/02/2014 Document Reviewed:  04/15/2013 Elsevier Interactive Patient Education 2016 [employer]          No Follow-up on file.  KColin BentonR.

## 2015-05-24 NOTE — Patient Instructions (Addendum)
BEFORE YO ULEAVE: -flu shot -labs -follow up in 6 months  Vit D3 [phone] IU daily  Adequate dietary intake of calcium (1200mg )  Please consider diet for acid reflux and trying to reduce or stop prilosec if you tolerate  We recommend the following healthy lifestyle measures: - eat a healthy whole foods diet consisting of regular small meals composed of vegetables, fruits, beans, nuts, seeds, healthy meats such as white chicken and fish and whole grains.  - avoid sweets, white starchy foods, fried foods, fast food, processed foods, sodas, red meet and other fattening foods.  - get a least 150-300 minutes of aerobic exercise per week.   -We have ordered labs or studies at this visit. It can take up to 1-2 weeks for results and processing. We will contact you with instructions IF your results are abnormal. Normal results will be released to your [facility]. If you have not heard from [place] or can not find your results in [facility] in 2 weeks please contact our office.   Food Choices for Gastroesophageal Reflux Disease, Adult When you have gastroesophageal reflux disease (GERD), the foods you eat and your eating habits are very important. Choosing the right foods can help ease the discomfort of GERD. WHAT GENERAL GUIDELINES DO I NEED TO FOLLOW?  Choose fruits, vegetables, whole grains, low-fat dairy products, and low-fat meat, fish, and poultry.  Limit fats such as oils, salad dressings, butter, nuts, and avocado.  Keep a food diary to identify foods that cause symptoms.  Avoid foods that cause reflux. These may be different for different people.  Eat frequent small meals instead of three large meals each day.  Eat your meals slowly, in a relaxed setting.  Limit fried foods.  Cook foods using methods other than frying.  Avoid drinking alcohol.  Avoid drinking large amounts of liquids with your meals.  Avoid bending over or lying down until 2-3 hours after eating. WHAT FOODS ARE NOT  RECOMMENDED? The following are some foods and drinks that may worsen your symptoms: Vegetables Tomatoes. Tomato juice. Tomato and spaghetti sauce. Chili peppers. Onion and garlic. Horseradish. Fruits Oranges, grapefruit, and lemon (fruit and juice). Meats High-fat meats, fish, and poultry. This includes hot dogs, ribs, ham, sausage, salami, and bacon. Dairy Whole milk and chocolate milk. Sour cream. Cream. Butter. Ice cream. Cream cheese.  Beverages Coffee and tea, with or without caffeine. Carbonated beverages or energy drinks. Condiments Hot sauce. Barbecue sauce.  Sweets/Desserts Chocolate and cocoa. Donuts. Peppermint and spearmint. Fats and Oils High-fat foods, including [place] fries and potato chips. Other Vinegar. Strong spices, such as black pepper, white pepper, red pepper, cayenne, curry powder, cloves, ginger, and chili powder. The items listed above may not be a complete list of foods and beverages to avoid. Contact your dietitian for more information.   This information is not intended to replace advice given to you by your health care provider. Make sure you discuss any questions you have with your health care provider.   Document Released: 06/11/2005 Document Revised: 07/02/2014 Document Reviewed: 04/15/2013 Elsevier Interactive Patient Education [employer].

## 2015-05-25 LAB — BASIC METABOLIC PANEL WITH GFR
BUN: 13 mg/dL (ref 7–25)
CALCIUM: 8.9 mg/dL (ref 8.6–10.4)
CHLORIDE: 105 mmol/L (ref 98–110)
CO2: 25 mmol/L (ref 20–31)
CREATININE: 0.97 mg/dL (ref 0.50–1.05)
GFR, Est African American: 77 mL/min (ref >=60)
GFR, Est Non African American: 67 mL/min (ref >=60)
Glucose, Bld: 75 mg/dL (ref 65–99)
Potassium: 4.4 mmol/L (ref 3.5–5.3)
SODIUM: 140 mmol/L (ref 135–146)

## 2015-08-02 ENCOUNTER — Other Ambulatory Visit: Payer: Self-pay | Admitting: Family Medicine

## 2015-11-22 ENCOUNTER — Ambulatory Visit: Payer: BLUE CROSS/BLUE SHIELD | Admitting: Family Medicine

## 2015-11-24 ENCOUNTER — Ambulatory Visit: Payer: BLUE CROSS/BLUE SHIELD | Admitting: Family Medicine

## 2016-01-02 ENCOUNTER — Ambulatory Visit: Payer: BLUE CROSS/BLUE SHIELD | Admitting: Family Medicine

## 2016-01-16 ENCOUNTER — Encounter: Payer: Self-pay | Admitting: Family Medicine

## 2016-01-16 ENCOUNTER — Ambulatory Visit (INDEPENDENT_AMBULATORY_CARE_PROVIDER_SITE_OTHER): Payer: BLUE CROSS/BLUE SHIELD | Admitting: Family Medicine

## 2016-01-16 VITALS — BP 100/74 | HR 82 | Temp 98.2°F | Ht 65.25 in | Wt 204.1 lb

## 2016-01-16 DIAGNOSIS — K219 Gastro-esophageal reflux disease without esophagitis: Secondary | ICD-10-CM | POA: Diagnosis not present

## 2016-01-16 DIAGNOSIS — Z6833 Body mass index (BMI) 33.0-33.9, adult: Secondary | ICD-10-CM | POA: Insufficient documentation

## 2016-01-16 DIAGNOSIS — G5603 Carpal tunnel syndrome, bilateral upper limbs: Secondary | ICD-10-CM | POA: Diagnosis not present

## 2016-01-16 DIAGNOSIS — E785 Hyperlipidemia, unspecified: Secondary | ICD-10-CM

## 2016-01-16 LAB — LIPID PANEL
CHOLESTEROL: 206 mg/dL — AB (ref 0–200)
HDL: 46.7 mg/dL (ref >39.00)
LDL Cholesterol: 131 mg/dL — ABNORMAL HIGH (ref 0–99)
NonHDL: 159.75
TRIGLYCERIDES: 142 mg/dL (ref 0.0–149.0)
Total CHOL/HDL Ratio: 4
VLDL: 28.4 mg/dL (ref 0.0–40.0)

## 2016-01-16 NOTE — Progress Notes (Signed)
HPI:  Bilat thumb pain: -chronic -worse after trimming bushes -sometimes pain bilat thumbs and occ tingling R thumb -no weakness, malaise, fevers  Mild HLD/Obesity: -advised healthy diet and regular CV exercise -she is working on her diet  - exercise has increased some -she does not want to take medications, prefers to work on lifestyle -fasting for recheck of labs today  GERD: -prilosec 20mg  daily -reports symptoms if stops PPI -denies: dysphagia, weight loss, vomiting  Genital herpes: -uses episodic tx rarely with valtrex  Hot Flashes/Fibroids/post menopausal bleeding: -treated with HRT with her gynecologist -seeing gyn for possible endometriosis -sees Juanda Chance annually for gyn physical  Depression: -takes wellbutrin 150mg  once daily and lexapro 10 mg for about 5-7 days  -sees Dr. Toy Care -doing well currently per her report -denies hx hospitalization, SI or any thoughts of self harm  ROS: See pertinent positives and negatives per HPI.  Past Medical History:  Diagnosis Date  . ALLERGIC RHINITIS 09/23/2007   Qualifier: Diagnosis of  By: Lenna Gilford MD, Deborra Medina   . Asthmatic bronchitis    UARS (upper airway resistance syndrome)  . DEPRESSION 09/23/2007   Qualifier: Diagnosis of  By: Lenna Gilford MD, Deborra Medina   . Genital herpes 06/03/2014  . GERD (gastroesophageal reflux disease)   . Hot flashes 06/03/2014  . HYPERCHOLESTEROLEMIA, BORDERLINE 09/23/2007   Qualifier: Diagnosis of  By: Lenna Gilford MD, Deborra Medina   . Palpitations   . Personal history of colonic polyps-adenoma 10/23/2011   Diminutive cecal tubular adenoma 09/2011 Carlean Purl)   . Uterine fibroid     Past Surgical History:  Procedure Laterality Date  . COLONOSCOPY    . WISDOM TOOTH EXTRACTION  1990    Family History  Problem Relation Age of Onset  . Heart disease Father     chf  . Hodgkin's lymphoma Sister   . Colon cancer Maternal Aunt 100  . Heart disease Mother     a fib  . Colon cancer Maternal Aunt   .  Uterine cancer Maternal Aunt   . Stomach cancer Neg Hx     Social History   Social History  . Marital status: Married    Spouse name: N/A  . Number of children: N/A  . Years of education: N/A   Occupational History  . Retired    Social History Main Topics  . Smoking status: Former Smoker    Packs/day: 0.25    Years: 2.00    Types: Cigarettes    Quit date: 06/25/1978  . Smokeless tobacco: Never Used  . Alcohol use No  . Drug use: No  . Sexual activity: Not Asked   Other Topics Concern  . None   Social History Narrative   Work or School: lost her job 10 years ago, stays at home now   No children      1 caffeine daily      Home Situation: lives with husband      Spiritual Beliefs: Christian - [employer]      Lifestyle: walking 3 days per week; diet is ok           Current Outpatient Prescriptions:  .  aspirin 81 MG tablet, Take 81 mg by mouth daily.  , Disp: , Rfl:  .  buPROPion (WELLBUTRIN SR) 150 MG 12 hr tablet, Take 150 mg by mouth 3 (three) times daily.  , Disp: , Rfl:  .  Coenzyme Q10 (COQ10) 100 MG CAPS, Take 1 capsule by mouth daily.  , Disp: , Rfl:  .  Cyanocobalamin (VITAMIN B-12) 2000 MCG TBCR, Take 2,500 mcg by mouth daily., Disp: , Rfl:  .  escitalopram (LEXAPRO) 10 MG tablet, Take 1/2 tablet by mouth daily, Disp: , Rfl:  .  glucosamine-chondroitin 500-400 MG tablet, Take 1 tablet by mouth 2 (two) times daily., Disp: , Rfl:  .  Krill Oil 300 MG CAPS, Take 1 capsule by mouth daily.  , Disp: , Rfl:  .  MINIVELLE 0.05 MG/24HR patch, , Disp: , Rfl:  .  omeprazole (PRILOSEC) 20 MG capsule, TAKE 1 CAPSULE DAILY, Disp: 90 capsule, Rfl: 1 .  Probiotic Product ([employer] PROBIOTIC) CAPS, Take by mouth. Pt unaware of strength. Once daily., Disp: , Rfl:  .  progesterone (PROMETRIUM) 100 MG capsule, Take 100 mg by mouth daily., Disp: , Rfl:  .  valACYclovir (VALTREX) 1000 MG tablet, daily as needed. Take as directed, Disp: , Rfl:   EXAM:  Vitals:   01/16/16 0842  BP:  100/74  Pulse: 82  Temp: 98.2 F (36.8 C)    Body mass index is 33.7 kg/m.  GENERAL: vitals reviewed and listed above, alert, oriented, appears well hydrated and in no acute distress  HEENT: atraumatic, conjunttiva clear, no obvious abnormalities on inspection of external nose and ears  NECK: no obvious masses on inspection  LUNGS: clear to auscultation bilaterally, no wheezes, rales or rhonchi, good air movement  CV: HRRR, no peripheral edema  MS: moves all extremities without noticeable abnormality, normal inspection of both hands and wrist except for some mild left thenar eminence atrophy, normal sensitivity to light touch in both hands and forearms bilaterally, normal strength in both hands and forearms, mildly positive Phalen's bilaterally  PSYCH: pleasant and cooperative, no obvious depression or anxiety  ASSESSMENT AND PLAN:  Discussed the following assessment and plan including implications, workup, treatment options and risks if new issues:  Bilateral carpal tunnel syndrome  Hyperlipemia - Plan: Lipid Panel  BMI 33.0-33.9,adult  Gastroesophageal reflux disease, esophagitis presence not specified  -see pt instructions and orders for plan -Patient advised to return or notify a doctor immediately if symptoms worsen or persist or new concerns arise.  Patient Instructions  BEFORE YOU LEAVE: -follow up: Physical in 4-6 months -labs  For the wrists/thumbs: -wellgate wrist splints for women, wear at night -aleve per instructions if needed on occasion for pain -ice after activities -adding tumeric in diet may help with inflammation  We recommend the following healthy lifestyle: 1) Small portions - eat off of salad plate instead of dinner plate 2) Eat a healthy clean diet with avoidance of (less then 1 serving per week) processed foods, sweetened drinks, white starches, red meat, fast foods and sweets and consisting of: * 5-9 servings per day of fresh or frozen  fruits and vegetables (not corn or potatoes, not dried or canned) *nuts and seeds, beans *olives and olive oil *small portions of lean meats such as fish and white chicken  *small portions of whole grains 3)Get at least 150 minutes of sweaty aerobic exercise per week 4)reduce stress - counseling, meditation, relaxation to balance other aspects of your life Website: www.[email or web address].com  We have ordered labs or studies at this visit. It can take up to 1-2 weeks for results and processing. IF results require follow up or explanation, we will call you with instructions. Clinically stable results will be released to your [facility]. If you have not heard from [place] or cannot find your results in [facility] in 2 weeks please contact our office at [phone].  If you are not yet signed up for [facility], please consider signing up.           Colin Benton R., DO

## 2016-01-16 NOTE — Progress Notes (Signed)
Pre visit review using our clinic review tool, if applicable. No additional management support is needed unless otherwise documented below in the visit note. 

## 2016-01-16 NOTE — Patient Instructions (Addendum)
BEFORE YOU LEAVE: -follow up: Physical in 4-6 months -labs  For the wrists/thumbs: -wellgate wrist splints for women, wear at night -aleve per instructions if needed on occasion for pain -ice after activities -adding tumeric in diet may help with inflammation  We recommend the following healthy lifestyle: 1) Small portions - eat off of salad plate instead of dinner plate 2) Eat a healthy clean diet with avoidance of (less then 1 serving per week) processed foods, sweetened drinks, white starches, red meat, fast foods and sweets and consisting of: * 5-9 servings per day of fresh or frozen fruits and vegetables (not corn or potatoes, not dried or canned) *nuts and seeds, beans *olives and olive oil *small portions of lean meats such as fish and white chicken  *small portions of whole grains 3)Get at least 150 minutes of sweaty aerobic exercise per week 4)reduce stress - counseling, meditation, relaxation to balance other aspects of your life Website: www.[email or web address].com  We have ordered labs or studies at this visit. It can take up to 1-2 weeks for results and processing. IF results require follow up or explanation, we will call you with instructions. Clinically stable results will be released to your [facility]. If you have not heard from [place] or cannot find your results in [facility] in 2 weeks please contact our office at [phone].  If you are not yet signed up for [facility], please consider signing up.

## 2016-02-29 ENCOUNTER — Other Ambulatory Visit: Payer: Self-pay | Admitting: Family Medicine

## 2016-05-29 ENCOUNTER — Ambulatory Visit (INDEPENDENT_AMBULATORY_CARE_PROVIDER_SITE_OTHER): Payer: BLUE CROSS/BLUE SHIELD

## 2016-05-29 ENCOUNTER — Ambulatory Visit: Payer: BLUE CROSS/BLUE SHIELD

## 2016-05-29 DIAGNOSIS — Z23 Encounter for immunization: Secondary | ICD-10-CM | POA: Diagnosis not present

## 2016-07-12 ENCOUNTER — Encounter: Payer: BLUE CROSS/BLUE SHIELD | Admitting: Family Medicine

## 2016-07-20 ENCOUNTER — Telehealth: Payer: Self-pay | Admitting: Family Medicine

## 2016-07-20 NOTE — Telephone Encounter (Signed)
[place] [facility] - Client [place] [facility] Patient Name: Claudia Warner DOB: 27-Sep-1960 Initial Comment She woke up with diarrhea, she feels cold, nausea, stomach pain and vomiting. Nurse Assessment Nurse: Dimas Chyle, RN, Dellis Filbert Date/Time Eilene Ghazi Time): 07/20/2016 12:15:30 PM Confirm and document reason for call. If symptomatic, describe symptoms. ---She woke up with diarrhea, she feels cold, nausea, stomach pain and vomiting. Low grade fever of 100. Multiple bouts of diarrhea. Vomited 9 times. Does the patient have any new or worsening symptoms? ---Yes Will a triage be completed? ---Yes Related visit to physician within the last 2 weeks? ---No Does the PT have any chronic conditions? (i.e. diabetes, asthma, etc.) ---No Is this a behavioral health or substance abuse call? ---No Guidelines Guideline Title Affirmed Question Affirmed Notes Vomiting [1] SEVERE vomiting (e.g., 6 or more times/day, vomits everything) BUT [2] hydrated (all triage questions negative) Final Disposition User Logan, RN, Dellis Filbert Disagree/Comply: Comply

## 2016-07-20 NOTE — Telephone Encounter (Signed)
Spoke with pt and she states that she does feel some better. She has not had any emesis or diarrhea for a couple of hours. Advised pt to try BRAT diet and increase fluid intake. Offered her phone number for Saturday clinic and also advised her to contact office if not improving by Monday. Nothing further needed at this time.

## 2016-07-20 NOTE — Telephone Encounter (Signed)
LMTCB

## 2016-09-10 ENCOUNTER — Ambulatory Visit (INDEPENDENT_AMBULATORY_CARE_PROVIDER_SITE_OTHER): Payer: BLUE CROSS/BLUE SHIELD | Admitting: Family Medicine

## 2016-09-10 ENCOUNTER — Encounter: Payer: Self-pay | Admitting: Family Medicine

## 2016-09-10 VITALS — BP 120/78 | HR 77 | Temp 98.5°F | Ht 65.25 in | Wt 208.4 lb

## 2016-09-10 DIAGNOSIS — J069 Acute upper respiratory infection, unspecified: Secondary | ICD-10-CM | POA: Diagnosis not present

## 2016-09-10 MED ORDER — BENZONATATE 100 MG PO CAPS: 100.0000 mg | ORAL_CAPSULE | Freq: Two times a day (BID) | ORAL | 0 refills | Status: DC | PRN

## 2016-09-10 NOTE — Progress Notes (Signed)
Pre visit review using our clinic review tool, if applicable. No additional management support is needed unless otherwise documented below in the visit note. 

## 2016-09-10 NOTE — Patient Instructions (Signed)
INSTRUCTIONS FOR UPPER RESPIRATORY INFECTION:  -plenty of rest and fluids  -nasal saline wash 2-3 times daily (use prepackaged nasal saline or bottled/distilled water if making your own)   -can use AFRIN nasal spray for drainage and nasal congestion - but do NOT use longer then 3-4 days  -can use tylenol (in no history of liver disease) or ibuprofen (if no history of kidney disease, bowel bleeding or significant heart disease) as directed for aches and sorethroat  -in the winter time, using a humidifier at night is helpful (please follow cleaning instructions)  -if you are taking a cough medication - use only as directed, may also try a teaspoon of honey to coat the throat and throat lozenges. Tessalon sent for cough.  -for sore throat, salt water gargles can help  -follow up if you have fevers, facial pain, tooth pain, difficulty breathing or are worsening or symptoms persist longer then expected  Upper Respiratory Infection, Adult An upper respiratory infection (URI) is also known as the common cold. It is often caused by a type of germ (virus). Colds are easily spread (contagious). You can pass it to others by kissing, coughing, sneezing, or drinking out of the same glass. Usually, you get better in 1 to 3  weeks.  However, the cough can last for even longer. HOME CARE   Only take medicine as told by your doctor. Follow instructions provided above.  Drink enough water and fluids to keep your pee (urine) clear or pale yellow.  Get plenty of rest.  Return to work when your temperature is < 100 for 24 hours or as told by your doctor. You may use a face mask and wash your hands to stop your cold from spreading. GET HELP RIGHT AWAY IF:   After the first few days, you feel you are getting worse.  You have questions about your medicine.  You have chills, shortness of breath, or red spit (mucus).  You have pain in the face for more then 1-2 days, especially when you bend  forward.  You have a fever, puffy (swollen) neck, pain when you swallow, or white spots in the back of your throat.  You have a bad headache, ear pain, sinus pain, or chest pain.  You have a high-pitched whistling sound when you breathe in and out (wheezing).  You cough up blood.  You have sore muscles or a stiff neck. MAKE SURE YOU:   Understand these instructions.  Will watch your condition.  Will get help right away if you are not doing well or get worse. Document Released: 11/28/2007 Document Revised: 09/03/2011 Document Reviewed: 09/16/2013 [facility] Patient Information 2015 [employer], [place]. This information is not intended to replace advice given to you by your health care provider. Make sure you discuss any questions you have with your health care provider.

## 2016-09-10 NOTE — Progress Notes (Signed)
HPI:  URI: -started: 7-10 days ago, not worsening, may be improving some -symptoms:nasal congestion, sore throat, cough -denies:fever, SOB, NVD, tooth pain, sinus pain, body aches -has tried: nothing -sick contacts/travel/risks: no reported flu, strep or tick exposure -Hx of: allergies ROS: See pertinent positives and negatives per HPI.  Past Medical History:  Diagnosis Date  . ALLERGIC RHINITIS 09/23/2007   Qualifier: Diagnosis of  By: Lenna Gilford MD, Deborra Medina   . Asthmatic bronchitis    UARS (upper airway resistance syndrome)  . DEPRESSION 09/23/2007   Qualifier: Diagnosis of  By: Lenna Gilford MD, Deborra Medina   . Genital herpes 06/03/2014  . GERD (gastroesophageal reflux disease)   . Hot flashes 06/03/2014  . HYPERCHOLESTEROLEMIA, BORDERLINE 09/23/2007   Qualifier: Diagnosis of  By: Lenna Gilford MD, Deborra Medina   . Palpitations   . Personal history of colonic polyps-adenoma 10/23/2011   Diminutive cecal tubular adenoma 09/2011 Carlean Purl)   . Uterine fibroid     Past Surgical History:  Procedure Laterality Date  . COLONOSCOPY    . WISDOM TOOTH EXTRACTION  1990    Family History  Problem Relation Age of Onset  . Heart disease Father     chf  . Hodgkin's lymphoma Sister   . Colon cancer Maternal Aunt 40  . Heart disease Mother     a fib  . Colon cancer Maternal Aunt   . Uterine cancer Maternal Aunt   . Stomach cancer Neg Hx     Social History   Social History  . Marital status: Married    Spouse name: N/A  . Number of children: N/A  . Years of education: N/A   Occupational History  . Retired    Social History Main Topics  . Smoking status: Former Smoker    Packs/day: 0.25    Years: 2.00    Types: Cigarettes    Quit date: 06/25/1978  . Smokeless tobacco: Never Used  . Alcohol use No  . Drug use: No  . Sexual activity: Not Asked   Other Topics Concern  . None   Social History Narrative   Work or School: lost her job 10 years ago, stays at home now   No children      1  caffeine daily      Home Situation: lives with husband      Spiritual Beliefs: Christian - [employer]      Lifestyle: walking 3 days per week; diet is ok           Current Outpatient Prescriptions:  .  aspirin 81 MG tablet, Take 81 mg by mouth daily.  , Disp: , Rfl:  .  Coenzyme Q10 (COQ10) 100 MG CAPS, Take 1 capsule by mouth daily.  , Disp: , Rfl:  .  Cyanocobalamin (VITAMIN B-12) 2000 MCG TBCR, Take 2,500 mcg by mouth daily., Disp: , Rfl:  .  glucosamine-chondroitin 500-400 MG tablet, Take 1 tablet by mouth 2 (two) times daily., Disp: , Rfl:  .  Krill Oil 300 MG CAPS, Take 1 capsule by mouth daily.  , Disp: , Rfl:  .  MINIVELLE 0.05 MG/24HR patch, , Disp: , Rfl:  .  omeprazole (PRILOSEC) 20 MG capsule, TAKE 1 CAPSULE DAILY, Disp: 90 capsule, Rfl: 1 .  Probiotic Product ([employer]S PROBIOTIC) CAPS, Take by mouth. Pt unaware of strength. Once daily., Disp: , Rfl:  .  progesterone (PROMETRIUM) 100 MG capsule, Take 100 mg by mouth daily., Disp: , Rfl:  .  valACYclovir (VALTREX) 1000 MG tablet,  daily as needed. Take as directed, Disp: , Rfl:  .  VIIBRYD 40 MG TABS, Take 40 mg by mouth daily., Disp: , Rfl: 0 .  benzonatate (TESSALON) 100 MG capsule, Take 1 capsule (100 mg total) by mouth 2 (two) times daily as needed for cough., Disp: 20 capsule, Rfl: 0  EXAM:  Vitals:   09/10/16 1619  BP: 120/78  Pulse: 77  Temp: 98.5 F (36.9 C)    Body mass index is 34.41 kg/m.  GENERAL: vitals reviewed and listed above, alert, oriented, appears well hydrated and in no acute distress  HEENT: atraumatic, conjunttiva clear, no obvious abnormalities on inspection of external nose and ears, normal appearance of ear canals and TMs, clear nasal congestion, mild post oropharyngeal erythema with PND, no tonsillar edema or exudate, no sinus TTP  NECK: no obvious masses on inspection  LUNGS: clear to auscultation bilaterally, no wheezes, rales or rhonchi, good air movement  CV: HRRR, no peripheral  edema  MS: moves all extremities without noticeable abnormality  PSYCH: pleasant and cooperative, no obvious depression or anxiety  ASSESSMENT AND PLAN:  Discussed the following assessment and plan:  Upper respiratory tract infection, unspecified type  -given HPI and exam findings today, a serious infection or illness is unlikely. We discussed potential etiologies, with VURI or AR being most likely, and advised supportive care and monitoring and tessalon for cough. We discussed treatment side effects, likely course, antibiotic misuse, transmission, and signs of developing a serious illness. Advise if worsening or not improving over next 3-4 days to let us know promptly. -of course, we advised to return or notify a doctor immediately if symptoms worsen or persist or new concerns arise.    Patient Instructions  INSTRUCTIONS FOR UPPER RESPIRATORY INFECTION:  -plenty of rest and fluids  -nasal saline wash 2-3 times daily (use prepackaged nasal saline or bottled/distilled water if making your own)   -can use AFRIN nasal spray for drainage and nasal congestion - but do NOT use longer then 3-4 days  -can use tylenol (in no history of liver disease) or ibuprofen (if no history of kidney disease, bowel bleeding or significant heart disease) as directed for aches and sorethroat  -in the winter time, using a humidifier at night is helpful (please follow cleaning instructions)  -if you are taking a cough medication - use only as directed, may also try a teaspoon of honey to coat the throat and throat lozenges. Tessalon sent for cough.  -for sore throat, salt water gargles can help  -follow up if you have fevers, facial pain, tooth pain, difficulty breathing or are worsening or symptoms persist longer then expected  Upper Respiratory Infection, Adult An upper respiratory infection (URI) is also known as the common cold. It is often caused by a type of germ (virus). Colds are easily spread  (contagious). You can pass it to others by kissing, coughing, sneezing, or drinking out of the same glass. Usually, you get better in 1 to 3  weeks.  However, the cough can last for even longer. HOME CARE   Only take medicine as told by your doctor. Follow instructions provided above.  Drink enough water and fluids to keep your pee (urine) clear or pale yellow.  Get plenty of rest.  Return to work when your temperature is < 100 for 24 hours or as told by your doctor. You may use a face mask and wash your hands to stop your cold from spreading. GET HELP RIGHT AWAY IF:  After the first few days, you feel you are getting worse.  You have questions about your medicine.  You have chills, shortness of breath, or red spit (mucus).  You have pain in the face for more then 1-2 days, especially when you bend forward.  You have a fever, puffy (swollen) neck, pain when you swallow, or white spots in the back of your throat.  You have a bad headache, ear pain, sinus pain, or chest pain.  You have a high-pitched whistling sound when you breathe in and out (wheezing).  You cough up blood.  You have sore muscles or a stiff neck. MAKE SURE YOU:   Understand these instructions.  Will watch your condition.  Will get help right away if you are not doing well or get worse. Document Released: 11/28/2007 Document Revised: 09/03/2011 Document Reviewed: 09/16/2013 [facility] Patient Information 2015 [place], [place]. This information is not intended to replace advice given to you by your health care provider. Make sure you discuss any questions you have with your health care provider.    Colin Benton R., DO

## 2016-10-08 NOTE — Progress Notes (Signed)
HPI:  Here for CPE: Due for labs.  -Concerns and/or follow up today:   HLD: -she wanted to try lifestyle changes  GERD: -prilosec 20mg  daily -reports symptoms if stops PPI -denies: dysphagia, weight loss, vomiting  Genital herpes: -uses episodic tx rarely with valtrex  Hot Flashes/Fibroids/post menopausal bleeding: -treated with HRT with her gynecologist -seeing gyn for possible endometriosis -sees Juanda Chance annually for gyn physical  Depression: -sees Dr. Toy Care -meds: viibryd -doing well currently per her report -denies hx hospitalization, SI or any thoughts of self harm  -Diet: admits is not good and "need to eat healthier"  -Exercise: no regular exercise  -Taking folic acid, vitamin D or calcium: no  -Diabetes and Dyslipidemia Screening: FASTING for screening  -Vaccines: UTD  -pap history: sees gyn  -wants STI testing (Hep C if born [birth year]): no  -FH breast, colon or ovarian ca: see FH Last mammogram: sees gyn for this - reports does yearly Last colon cancer screening: 02/2015 - 5 year repeat advised per GI letter  Review of Systems - no fevers, unintentional weight loss, vision loss, hearing loss, chest pain, sob, hemoptysis, melena, hematochezia, hematuria, genital discharge, changing or concerning skin lesions, bleeding, bruising, loc, thoughts of self harm or SI  Past Medical History:  Diagnosis Date  . ALLERGIC RHINITIS 09/23/2007   Qualifier: Diagnosis of  By: Lenna Gilford MD, Deborra Medina   . Asthmatic bronchitis    UARS (upper airway resistance syndrome)  . DEPRESSION 09/23/2007   Qualifier: Diagnosis of  By: Lenna Gilford MD, Deborra Medina   . Genital herpes 06/03/2014  . GERD (gastroesophageal reflux disease)   . Hot flashes 06/03/2014  . HYPERCHOLESTEROLEMIA, BORDERLINE 09/23/2007   Qualifier: Diagnosis of  By: Lenna Gilford MD, Deborra Medina   . Palpitations   . Personal history of colonic polyps-adenoma 10/23/2011   Diminutive cecal tubular adenoma 09/2011 Carlean Purl)   .  Uterine fibroid     Past Surgical History:  Procedure Laterality Date  . COLONOSCOPY    . WISDOM TOOTH EXTRACTION  1990    Family History  Problem Relation Age of Onset  . Heart disease Father     chf  . Hodgkin's lymphoma Sister   . Colon cancer Maternal Aunt 73  . Heart disease Mother     a fib  . Colon cancer Maternal Aunt   . Uterine cancer Maternal Aunt   . Stomach cancer Neg Hx     Social History   Social History  . Marital status: Married    Spouse name: N/A  . Number of children: N/A  . Years of education: N/A   Occupational History  . Retired    Social History Main Topics  . Smoking status: Former Smoker    Packs/day: 0.25    Years: 2.00    Types: Cigarettes    Quit date: 06/25/1978  . Smokeless tobacco: Never Used  . Alcohol use No  . Drug use: No  . Sexual activity: Not Asked   Other Topics Concern  . None   Social History Narrative   Work or School: lost her job 10 years ago, stays at home now   No children      1 caffeine daily      Home Situation: lives with husband      Spiritual Beliefs: [place]      Lifestyle: walking 3 days per week; diet is ok           Current Outpatient Prescriptions:  .  aspirin  81 MG tablet, Take 81 mg by mouth daily.  , Disp: , Rfl:  .  Coenzyme Q10 (COQ10) 100 MG CAPS, Take 1 capsule by mouth daily.  , Disp: , Rfl:  .  Cyanocobalamin (VITAMIN B-12) 2000 MCG TBCR, Take 2,500 mcg by mouth daily., Disp: , Rfl:  .  glucosamine-chondroitin 500-400 MG tablet, Take 1 tablet by mouth 2 (two) times daily., Disp: , Rfl:  .  Krill Oil 300 MG CAPS, Take 1 capsule by mouth daily.  , Disp: , Rfl:  .  MINIVELLE 0.05 MG/24HR patch, , Disp: , Rfl:  .  omeprazole (PRILOSEC) 20 MG capsule, TAKE 1 CAPSULE DAILY, Disp: 90 capsule, Rfl: 1 .  Probiotic Product (CVS PROBIOTIC) CAPS, Take by mouth. Pt unaware of strength. Once daily., Disp: , Rfl:  .  progesterone (PROMETRIUM) 100 MG capsule, Take 100 mg by mouth  daily., Disp: , Rfl:  .  valACYclovir (VALTREX) 1000 MG tablet, daily as needed. Take as directed, Disp: , Rfl:  .  VIIBRYD 40 MG TABS, Take 40 mg by mouth daily., Disp: , Rfl: 0  EXAM:  Vitals:   10/09/16 0908  BP: 100/78  Pulse: 68  Temp: 98.1 F (36.7 C)  Body mass index is 34.43 kg/m.   GENERAL: vitals reviewed and listed below, alert, oriented, appears well hydrated and in no acute distress  HEENT: head atraumatic, PERRLA, normal appearance of eyes, ears, nose and mouth. moist mucus membranes.  NECK: supple, no masses or lymphadenopathy  LUNGS: clear to auscultation bilaterally, no rales, rhonchi or wheeze  CV: HRRR, no peripheral edema or cyanosis, normal pedal pulses  ABDOMEN: bowel sounds normal, soft, non tender to palpation, no masses, no rebound or guarding  SKIN: no rash or abnormal lesions, declined full skin check  GU/BREAST: declined, does with gyn  MS: normal gait, moves all extremities normally  NEURO: normal gait, speech and thought processing grossly intact, muscle tone grossly intact throughout  PSYCH: normal affect, pleasant and cooperative  ASSESSMENT AND PLAN:  Discussed the following assessment and plan:  Encounter for preventive health examination -Discussed and advised all [place] preventive services health task force level A and B recommendations for age, sex and risks.  -Advised at least 150 minutes of exercise per week and a healthy diet with avoidance of (less then 1 serving per week) processed foods, white starches, red meat, fast foods and sweets and consisting of: * 5-9 servings of fresh fruits and vegetables (not corn or potatoes) *nuts and seeds, beans *olives and olive oil *lean meats such as fish and white chicken  *whole grains  -FASTING labs, studies and vaccines per orders this encounter  Hyperlipidemia, unspecified hyperlipidemia type - Plan: Lipid panel -lifestyle recs  Gastroesophageal reflux disease, esophagitis presence  not specified -cont current tx, lifestyle recs  Recurrent major depressive disorder, in full remission [facility]) -sees psychiatrist for management  BMI 34.0-34.9,adult - Plan: Hemoglobin A1c -lifestyle recs  Orders Placed This Encounter  Procedures  . Hemoglobin A1c  . Lipid panel    Patient advised to return to clinic immediately if symptoms worsen or persist or new concerns.  Patient Instructions  BEFORE YOU LEAVE: -follow up: 6 months -labs  See your gynecologist yearly.  We have ordered labs or studies at this visit. It can take up to 1-2 weeks for results and processing. IF results require follow up or explanation, we will call you with instructions. Clinically stable results will be released to your [facility]. If you have not  heard from [place] or cannot find your results in [facility] in 2 weeks please contact our office at [phone].  If you are not yet signed up for [facility], please consider signing up.   We recommend the following healthy lifestyle for LIFE: 1) Small portions.   Tip: eat off of a salad plate instead of a dinner plate.  Tip: It is ok to feel hungry after a meal - that likely means you ate an appropriate portion.  Tip: if you need more or a snack choose fruits, veggies and/or a handful of nuts or seeds.  2) Eat a healthy clean diet.  * Tip: Avoid (less then 1 serving per week): processed foods, sweets, sweetened drinks, white starches (rice, flour, bread, potatoes, pasta, etc), red meat, fast foods, butter  *Tip: CHOOSE instead   * 5-9 servings per day of fresh or frozen fruits and vegetables (but not corn, potatoes, bananas, canned or dried fruit)   *nuts and seeds, beans   *olives and olive oil   *small portions of lean meats such as fish and white chicken    *small portions of whole grains  3)Get at least 150 minutes of sweaty aerobic exercise per week.  4)Reduce stress - consider counseling, meditation and relaxation to balance other aspects of your  life.  WE NOW OFFER   [place] [facility] FAST TRACK!!!  SAME DAY Appointments for ACUTE CARE  Such as: Sprains, Injuries, cuts, abrasions, rashes, muscle pain, joint pain, back pain Colds, flu, sore throats, headache, allergies, cough, fever  Ear pain, sinus and eye infections Abdominal pain, nausea, vomiting, diarrhea, upset stomach Animal/insect bites  3 Easy Ways to Schedule: Walk-In Scheduling Call in scheduling Mychart Sign-up: [email or web address]            No Follow-up on file.  Colin Benton R., DO

## 2016-10-09 ENCOUNTER — Ambulatory Visit (INDEPENDENT_AMBULATORY_CARE_PROVIDER_SITE_OTHER): Payer: BLUE CROSS/BLUE SHIELD | Admitting: Family Medicine

## 2016-10-09 ENCOUNTER — Encounter: Payer: Self-pay | Admitting: Family Medicine

## 2016-10-09 VITALS — BP 100/78 | HR 68 | Temp 98.1°F | Ht 65.25 in | Wt 208.5 lb

## 2016-10-09 DIAGNOSIS — E785 Hyperlipidemia, unspecified: Secondary | ICD-10-CM

## 2016-10-09 DIAGNOSIS — K219 Gastro-esophageal reflux disease without esophagitis: Secondary | ICD-10-CM

## 2016-10-09 DIAGNOSIS — Z0000 Encounter for general adult medical examination without abnormal findings: Secondary | ICD-10-CM

## 2016-10-09 DIAGNOSIS — Z6834 Body mass index (BMI) 34.0-34.9, adult: Secondary | ICD-10-CM

## 2016-10-09 DIAGNOSIS — F3342 Major depressive disorder, recurrent, in full remission: Secondary | ICD-10-CM

## 2016-10-09 LAB — LIPID PANEL
CHOL/HDL RATIO: 5
Cholesterol: 240 mg/dL — ABNORMAL HIGH (ref 0–200)
HDL: 46.6 mg/dL (ref >39.00)
LDL CALC: 164 mg/dL — AB (ref 0–99)
NonHDL: 193.88
TRIGLYCERIDES: 150 mg/dL — AB (ref 0.0–149.0)
VLDL: 30 mg/dL (ref 0.0–40.0)

## 2016-10-09 LAB — HEMOGLOBIN A1C: HEMOGLOBIN A1C: 5.8 % (ref 4.6–6.5)

## 2016-10-09 NOTE — Patient Instructions (Signed)
BEFORE YOU LEAVE: -follow up: 6 months -labs  See your gynecologist yearly.  We have ordered labs or studies at this visit. It can take up to 1-2 weeks for results and processing. IF results require follow up or explanation, we will call you with instructions. Clinically stable results will be released to your [facility]. If you have not heard from [place] or cannot find your results in [facility] in 2 weeks please contact our office at [phone].  If you are not yet signed up for [facility], please consider signing up.   We recommend the following healthy lifestyle for LIFE: 1) Small portions.   Tip: eat off of a salad plate instead of a dinner plate.  Tip: It is ok to feel hungry after a meal - that likely means you ate an appropriate portion.  Tip: if you need more or a snack choose fruits, veggies and/or a handful of nuts or seeds.  2) Eat a healthy clean diet.  * Tip: Avoid (less then 1 serving per week): processed foods, sweets, sweetened drinks, white starches (rice, flour, bread, potatoes, pasta, etc), red meat, fast foods, butter  *Tip: CHOOSE instead   * 5-9 servings per day of fresh or frozen fruits and vegetables (but not corn, potatoes, bananas, canned or dried fruit)   *nuts and seeds, beans   *olives and olive oil   *small portions of lean meats such as fish and white chicken    *small portions of whole grains  3)Get at least 150 minutes of sweaty aerobic exercise per week.  4)Reduce stress - consider counseling, meditation and relaxation to balance other aspects of your life.  WE NOW OFFER   [place] [facility] FAST TRACK!!!  SAME DAY Appointments for ACUTE CARE  Such as: Sprains, Injuries, cuts, abrasions, rashes, muscle pain, joint pain, back pain Colds, flu, sore throats, headache, allergies, cough, fever  Ear pain, sinus and eye infections Abdominal pain, nausea, vomiting, diarrhea, upset stomach Animal/insect bites  3 Easy Ways to Schedule: Walk-In  Scheduling Call in scheduling Mychart Sign-up: [email or web address]

## 2016-10-09 NOTE — Progress Notes (Signed)
Pre visit review using our clinic review tool, if applicable. No additional management support is needed unless otherwise documented below in the visit note. 

## 2017-01-14 ENCOUNTER — Ambulatory Visit: Payer: BLUE CROSS/BLUE SHIELD | Admitting: Family Medicine

## 2017-01-15 ENCOUNTER — Ambulatory Visit: Payer: BLUE CROSS/BLUE SHIELD | Admitting: Family Medicine

## 2017-01-29 DIAGNOSIS — Z01419 Encounter for gynecological examination (general) (routine) without abnormal findings: Secondary | ICD-10-CM | POA: Diagnosis not present

## 2017-01-29 DIAGNOSIS — Z1231 Encounter for screening mammogram for malignant neoplasm of breast: Secondary | ICD-10-CM | POA: Diagnosis not present

## 2017-01-29 DIAGNOSIS — Z6831 Body mass index (BMI) 31.0-31.9, adult: Secondary | ICD-10-CM | POA: Diagnosis not present

## 2017-02-04 ENCOUNTER — Ambulatory Visit: Payer: BLUE CROSS/BLUE SHIELD | Admitting: Family Medicine

## 2017-03-06 NOTE — Progress Notes (Addendum)
HPI:  Claudia Warner is a pleasant 56 y.o. here for follow up. Chronic medical problems summarized below were reviewed for changes.  Doing well for the most part. She has been working on an improved diet and some increased exercise. She has been able to lose some weight. She would like to recheck her labs today and is fasting. She has a new complaint of chronic low back pain, see below for details.Denies CP, SOB, DOE, treatment intolerance or new symptoms. Due labs, flu shot  Back pain: -chronic, intermittent for years -low back -in last year more persistent, mild discomfort R low back -saw chiropractor in the past, prefers natural treatment for this -no radiation, fevers, malaise, weakness, numbness, change in bowel or bladder function  HLD/Prediabetes/Obesity: -recommended lifestyle changes and consideration statin -she wanted to try lifestyle changes - reports is eating a little better and is more active -no regular aerobic exercise -she has lost weight and is happy about this -wt 09/2016 208 --> 02/2017 191 -wants to recheck labs  GERD: -prilosec 20mg  daily -reports symptoms if stops PPI -denies: dysphagia, weight loss, vomiting  Genital herpes: -uses episodic tx rarely with valtrex  Hot Flashes/Fibroids/post menopausal bleeding: -treated with HRT with her gynecologist -seeing gyn for possible endometriosis -sees Juanda Chance annually for gyn physical  Depression: -sees Dr. Toy Care -meds: viibryd -doing well currently per her report -denies hx hospitalization, SI or any thoughts of self harm  ROS: See pertinent positives and negatives per HPI.  Past Medical History:  Diagnosis Date  . ALLERGIC RHINITIS 09/23/2007   Qualifier: Diagnosis of  By: Lenna Gilford MD, Deborra Medina   . Asthmatic bronchitis    UARS (upper airway resistance syndrome)  . DEPRESSION 09/23/2007   Qualifier: Diagnosis of  By: Lenna Gilford MD, Deborra Medina   . Genital herpes 06/03/2014  . GERD (gastroesophageal reflux  disease)   . Hot flashes 06/03/2014  . HYPERCHOLESTEROLEMIA, BORDERLINE 09/23/2007   Qualifier: Diagnosis of  By: Lenna Gilford MD, Deborra Medina   . Palpitations   . Personal history of colonic polyps-adenoma 10/23/2011   Diminutive cecal tubular adenoma 09/2011 Carlean Purl)   . Uterine fibroid     Past Surgical History:  Procedure Laterality Date  . COLONOSCOPY    . WISDOM TOOTH EXTRACTION  1990    Family History  Problem Relation Age of Onset  . Heart disease Father        chf  . Hodgkin's lymphoma Sister   . Colon cancer Maternal Aunt 61  . Heart disease Mother        a fib  . Colon cancer Maternal Aunt   . Uterine cancer Maternal Aunt   . Stomach cancer Neg Hx     Social History   Social History  . Marital status: Married    Spouse name: N/A  . Number of children: N/A  . Years of education: N/A   Occupational History  . Retired    Social History Main Topics  . Smoking status: Former Smoker    Packs/day: 0.25    Years: 2.00    Types: Cigarettes    Quit date: 06/25/1978  . Smokeless tobacco: Never Used  . Alcohol use No  . Drug use: No  . Sexual activity: Not Asked   Other Topics Concern  . None   Social History Narrative   Work or School: lost her job 10 years ago, stays at home now   No children      1 caffeine daily  Home Situation: lives with husband      Spiritual Beliefs: Christian - [employer]      Lifestyle: walking 3 days per week; diet is ok           Current Outpatient Prescriptions:  .  aspirin 81 MG tablet, Take 81 mg by mouth daily.  , Disp: , Rfl:  .  buPROPion (WELLBUTRIN XL) 150 MG 24 hr tablet, Take 150 mg by mouth daily., Disp: , Rfl:  .  escitalopram (LEXAPRO) 10 MG tablet, Take 5 mg by mouth daily., Disp: , Rfl:  .  Krill Oil 300 MG CAPS, Take 1 capsule by mouth daily.  , Disp: , Rfl:  .  MINIVELLE 0.05 MG/24HR patch, , Disp: , Rfl:  .  omeprazole (PRILOSEC) 20 MG capsule, TAKE 1 CAPSULE DAILY (Patient taking differently: TAKE 1 CAPSULE  AS NEEDED), Disp: 90 capsule, Rfl: 1 .  Probiotic Product (CVS PROBIOTIC) CAPS, Take by mouth. Pt unaware of strength. Once daily., Disp: , Rfl:  .  progesterone (PROMETRIUM) 100 MG capsule, Take 100 mg by mouth daily., Disp: , Rfl:  .  valACYclovir (VALTREX) 1000 MG tablet, daily as needed. Take as directed, Disp: , Rfl:   EXAM:  Vitals:   03/07/17 0828  BP: 102/62  Pulse: 76  Temp: 98.1 F (36.7 C)    Body mass index is 31.67 kg/m.  GENERAL: vitals reviewed and listed above, alert, oriented, appears well hydrated and in no acute distress  HEENT: atraumatic, conjunttiva clear, no obvious abnormalities on inspection of external nose and ears  NECK: no obvious masses on inspection  LUNGS: clear to auscultation bilaterally, no wheezes, rales or rhonchi, good air movement  CV: HRRR, no peripheral edema  MS: moves all extremities without noticeable abnormality, Normal Gait Normal inspection of back, ecept for head and neck forward posture and fullness along R lumbar paraspinal region. No bony TTP Soft tissue TTP MB:TDHRC upper lumbar paraspinal muscles -/+ tests: neg trendelenburg,-facet loading, -SLRT, -CLRT, -FABER, -FADIR Normal muscle strength, sensation to light touch and DTRs in LEs bilaterally  PSYCH: pleasant and cooperative, no obvious depression or anxiety  ASSESSMENT AND PLAN:  Discussed the following assessment and plan:  Hyperlipidemia, unspecified hyperlipidemia type - Plan: Lipid panel  Hyperglycemia - Plan: Hemoglobin A1c  BMI 31.0-31.9,adult  Chronic right-sided low back pain without sciatica - Plan: DG Lumbar Spine Complete  Recurrent major depressive disorder, in full remission ([place])  -discussed etiologies of low back pain, we will get x-rays today, she opted to start conservatively with home exercises and topical treatments and may consider osteopathic treatments -Labs today -Lifestyle recommendations, complemented on changes -Flu shot  today -Patient advised to return or notify a doctor immediately if symptoms worsen or persist or new concerns arise.  Patient Instructions  BEFORE YOU LEAVE: -low back exercises -xray sheet -flu shot -[name], help her schedule OMT appointment if she wishes (30 minutes - very first or very last appointments of clinic session) -follow up: regular follow up in 3-4 months  Go get xrays of back.  Home exercises 4 days per week.  Can use heat and tiger balm as needed for pain.  We have ordered labs or studies at this visit. It can take up to 1-2 weeks for results and processing. IF results require follow up or explanation, we will call you with instructions. Clinically stable results will be released to your [facility]. If you have not heard from [place] or cannot find your results in [facility] in  2 weeks please contact our office at [phone].  If you are not yet signed up for [facility], please consider signing up.  We recommend the following healthy lifestyle for LIFE: 1) Small portions.   Tip: eat off of a salad plate instead of a dinner plate.  Tip: It is ok to feel hungry after a meal - that likely means you ate an appropriate portion.  Tip: if you need more or a snack choose fruits, veggies and/or a handful of nuts or seeds.  2) Eat a healthy clean diet.   TRY TO EAT: -at least 5-7 servings of low sugar vegetables per day (not corn, potatoes or bananas.) -berries are the best choice if you wish to eat fruit.   -lean meets (fish, chicken or [place] breasts) -vegan proteins for some meals - beans or tofu, whole grains, nuts and seeds -Replace bad fats with good fats - good fats include: fish, nuts and seeds, canola oil, olive oil -small amounts of low fat or non fat dairy -small amounts of100 % whole grains - check the lables  AVOID: -SUGAR, sweets, anything with added sugar, corn syrup or sweeteners -if you must have a sweetener, small amounts of stevia may be best -sweetened  beverages -simple starches (rice, bread, potatoes, pasta, chips, etc - small amounts of 100% whole grains are ok) -red meat, pork, butter -fried foods, fast food, processed food, excessive dairy, eggs and coconut.  3)Get at least 150 minutes of sweaty aerobic exercise per week.  4)Reduce stress - consider counseling, meditation and relaxation to balance other aspects of your life.  WE NOW OFFER   [place] [facility] FAST TRACK!!!  SAME DAY Appointments for ACUTE CARE  Such as: Sprains, Injuries, cuts, abrasions, rashes, muscle pain, joint pain, back pain Colds, flu, sore throats, headache, allergies, cough, fever  Ear pain, sinus and eye infections Abdominal pain, nausea, vomiting, diarrhea, upset stomach Animal/insect bites  3 Easy Ways to Schedule: Walk-In Scheduling Call in scheduling Mychart Sign-up: [email or web address]            Colin Benton R., DO

## 2017-03-07 ENCOUNTER — Ambulatory Visit (INDEPENDENT_AMBULATORY_CARE_PROVIDER_SITE_OTHER)
Admission: RE | Admit: 2017-03-07 | Discharge: 2017-03-07 | Disposition: A | Discharge status: Home / Self Care | Payer: BLUE CROSS/BLUE SHIELD | Source: Ambulatory Visit | Attending: Family Medicine | Admitting: Family Medicine

## 2017-03-07 ENCOUNTER — Encounter: Payer: Self-pay | Admitting: Family Medicine

## 2017-03-07 ENCOUNTER — Ambulatory Visit (INDEPENDENT_AMBULATORY_CARE_PROVIDER_SITE_OTHER): Payer: BLUE CROSS/BLUE SHIELD | Admitting: Family Medicine

## 2017-03-07 VITALS — BP 102/62 | HR 76 | Temp 98.1°F | Ht 65.25 in | Wt 191.8 lb

## 2017-03-07 DIAGNOSIS — M545 Low back pain: Secondary | ICD-10-CM | POA: Diagnosis not present

## 2017-03-07 DIAGNOSIS — G8929 Other chronic pain: Secondary | ICD-10-CM

## 2017-03-07 DIAGNOSIS — Z6831 Body mass index (BMI) 31.0-31.9, adult: Secondary | ICD-10-CM

## 2017-03-07 DIAGNOSIS — R739 Hyperglycemia, unspecified: Secondary | ICD-10-CM

## 2017-03-07 DIAGNOSIS — F3342 Major depressive disorder, recurrent, in full remission: Secondary | ICD-10-CM

## 2017-03-07 DIAGNOSIS — Z23 Encounter for immunization: Secondary | ICD-10-CM

## 2017-03-07 DIAGNOSIS — E785 Hyperlipidemia, unspecified: Secondary | ICD-10-CM

## 2017-03-07 LAB — LIPID PANEL
CHOLESTEROL: 185 mg/dL (ref 0–200)
HDL: 58.4 mg/dL (ref >39.00)
LDL CALC: 104 mg/dL — AB (ref 0–99)
NonHDL: 126.82
TRIGLYCERIDES: 113 mg/dL (ref 0.0–149.0)
Total CHOL/HDL Ratio: 3
VLDL: 22.6 mg/dL (ref 0.0–40.0)

## 2017-03-07 LAB — HEMOGLOBIN A1C: Hgb A1c MFr Bld: 5.6 % (ref 4.6–6.5)

## 2017-03-07 NOTE — Patient Instructions (Signed)
BEFORE YOU LEAVE: -low back exercises -xray sheet -flu shot -[name], help her schedule OMT appointment if she wishes (30 minutes - very first or very last appointments of clinic session) -follow up: regular follow up in 3-4 months  Go get xrays of back.  Home exercises 4 days per week.  Can use heat and tiger balm as needed for pain.  We have ordered labs or studies at this visit. It can take up to 1-2 weeks for results and processing. IF results require follow up or explanation, we will call you with instructions. Clinically stable results will be released to your [facility]. If you have not heard from [place] or cannot find your results in [facility] in 2 weeks please contact our office at [phone].  If you are not yet signed up for [facility], please consider signing up.  We recommend the following healthy lifestyle for LIFE: 1) Small portions.   Tip: eat off of a salad plate instead of a dinner plate.  Tip: It is ok to feel hungry after a meal - that likely means you ate an appropriate portion.  Tip: if you need more or a snack choose fruits, veggies and/or a handful of nuts or seeds.  2) Eat a healthy clean diet.   TRY TO EAT: -at least 5-7 servings of low sugar vegetables per day (not corn, potatoes or bananas.) -berries are the best choice if you wish to eat fruit.   -lean meets (fish, chicken or [place] breasts) -vegan proteins for some meals - beans or tofu, whole grains, nuts and seeds -Replace bad fats with good fats - good fats include: fish, nuts and seeds, canola oil, olive oil -small amounts of low fat or non fat dairy -small amounts of100 % whole grains - check the lables  AVOID: -SUGAR, sweets, anything with added sugar, corn syrup or sweeteners -if you must have a sweetener, small amounts of stevia may be best -sweetened beverages -simple starches (rice, bread, potatoes, pasta, chips, etc - small amounts of 100% whole grains are ok) -red meat, pork, butter -fried foods,  fast food, processed food, excessive dairy, eggs and coconut.  3)Get at least 150 minutes of sweaty aerobic exercise per week.  4)Reduce stress - consider counseling, meditation and relaxation to balance other aspects of your life.  WE NOW OFFER   [place] [facility] FAST TRACK!!!  SAME DAY Appointments for ACUTE CARE  Such as: Sprains, Injuries, cuts, abrasions, rashes, muscle pain, joint pain, back pain Colds, flu, sore throats, headache, allergies, cough, fever  Ear pain, sinus and eye infections Abdominal pain, nausea, vomiting, diarrhea, upset stomach Animal/insect bites  3 Easy Ways to Schedule: Walk-In Scheduling Call in scheduling Mychart Sign-up: [email or web address]

## 2017-03-07 NOTE — Addendum Note (Signed)
Addended by: Lucretia Kern on: 03/07/2017 08:59 AM   Modules accepted: Orders

## 2017-03-07 NOTE — Addendum Note (Signed)
Addended by: Tomi Likens on: 03/07/2017 09:07 AM   Modules accepted: Orders

## 2017-03-07 NOTE — Addendum Note (Signed)
Addended by: Agnes Lawrence on: 03/07/2017 09:25 AM   Modules accepted: Orders

## 2017-03-10 NOTE — Progress Notes (Signed)
HPI:  Claudia Warner is a pleasant 56 yo who reports she is here for osteopathic treatment for her back pain. She mentioned this at her last visit and we did some xrays. See below.   Back pain: -reportedly intermittent for many years, worse in last 1 year -low back, R> L, mild to mod in severity, usually mild, but we'll get worse with certain activities -prefers natural, non surgical treatments -saw chiropractor in the past -denies: radiation of pain, fever, malaise, wekaness, numbness, change in bowel or bladder function  Neck and shoulder pain: -Today she also admits that she sometimes has some neck and shoulder pain as well -She has carpal tunnel syndrome and the risks per report, so occasionally gets some tingling in her thumbs -Does not feel she has radicular symptoms, radiation of pain, weakness or numbness in radicular pattern down the arms  Per xray report lumbar spine multiple views 02/2017: "Lumbar spine scoliosis concave right. 4 mm retrolisthesis L2 on L3 and L3 on L4. No acute bony abnormality identified."  ROS: See pertinent positives and negatives per HPI.  Past Medical History:  Diagnosis Date  . ALLERGIC RHINITIS 09/23/2007   Qualifier: Diagnosis of  By: Lenna Gilford MD, Deborra Medina   . Asthmatic bronchitis    UARS (upper airway resistance syndrome)  . DEPRESSION 09/23/2007   Qualifier: Diagnosis of  By: Lenna Gilford MD, Deborra Medina   . Genital herpes 06/03/2014  . GERD (gastroesophageal reflux disease)   . Hot flashes 06/03/2014  . HYPERCHOLESTEROLEMIA, BORDERLINE 09/23/2007   Qualifier: Diagnosis of  By: Lenna Gilford MD, Deborra Medina   . Palpitations   . Personal history of colonic polyps-adenoma 10/23/2011   Diminutive cecal tubular adenoma 09/2011 Carlean Purl)   . Uterine fibroid     Past Surgical History:  Procedure Laterality Date  . COLONOSCOPY    . WISDOM TOOTH EXTRACTION  1990    Family History  Problem Relation Age of Onset  . Heart disease Father        chf  . Hodgkin's lymphoma  Sister   . Colon cancer Maternal Aunt 54  . Heart disease Mother        a fib  . Colon cancer Maternal Aunt   . Uterine cancer Maternal Aunt   . Stomach cancer Neg Hx     Social History   Social History  . Marital status: Married    Spouse name: N/A  . Number of children: N/A  . Years of education: N/A   Occupational History  . Retired    Social History Main Topics  . Smoking status: Former Smoker    Packs/day: 0.25    Years: 2.00    Types: Cigarettes    Quit date: 06/25/1978  . Smokeless tobacco: Never Used  . Alcohol use No  . Drug use: No  . Sexual activity: Not Asked   Other Topics Concern  . None   Social History Narrative   Work or School: lost her job 10 years ago, stays at home now   No children      1 caffeine daily      Home Situation: lives with husband      Spiritual Beliefs: Christian - [employer]      Lifestyle: walking 3 days per week; diet is ok           Current Outpatient Prescriptions:  .  aspirin 81 MG tablet, Take 81 mg by mouth daily.  , Disp: , Rfl:  .  buPROPion (WELLBUTRIN XL)  150 MG 24 hr tablet, Take 150 mg by mouth daily., Disp: , Rfl:  .  escitalopram (LEXAPRO) 10 MG tablet, Take 5 mg by mouth daily., Disp: , Rfl:  .  Krill Oil 300 MG CAPS, Take 1 capsule by mouth daily.  , Disp: , Rfl:  .  MINIVELLE 0.05 MG/24HR patch, , Disp: , Rfl:  .  omeprazole (PRILOSEC) 20 MG capsule, TAKE 1 CAPSULE DAILY (Patient taking differently: TAKE 1 CAPSULE AS NEEDED), Disp: 90 capsule, Rfl: 1 .  Probiotic Product ([employer] PROBIOTIC) CAPS, Take by mouth. Pt unaware of strength. Once daily., Disp: , Rfl:  .  progesterone (PROMETRIUM) 100 MG capsule, Take 100 mg by mouth daily., Disp: , Rfl:  .  valACYclovir (VALTREX) 1000 MG tablet, daily as needed. Take as directed, Disp: , Rfl:  .  tizanidine (ZANAFLEX) 2 MG capsule, Take 1 capsule (2 mg total) by mouth at bedtime as needed for muscle spasms., Disp: 20 capsule, Rfl: 0  EXAM:  Vitals:   03/11/17 0731   BP: 100/78  Pulse: 68  Temp: 98.5 F (36.9 C)    Body mass index is 31.54 kg/m.  GENERAL: vitals reviewed and listed above, alert, oriented, appears well hydrated and in no acute distress  HEENT: atraumatic, conjunttiva clear, no obvious abnormalities on inspection of external nose and ears  NECK: no obvious masses on inspection  MS: moves all extremities without noticeable abnormality, gait is normal, she has a head and shoulders forward posture, somewhat exaggerated lumbar curvature  PSYCH: pleasant and cooperative, no obvious depression or anxiety  ASSESSMENT AND PLAN:  Discussed the following assessment and plan:  Chronic bilateral low back pain without sciatica  Neck pain  Chronic pain of both shoulders  -reviewed her x-rays with her and discussed first treatment options for poor posture of the head and neck and for low back pain -will touch base with our sports medicine doctor to see if her physical therapist would feel good about treating her with her x-ray findings, or whether we should get an MRI first -patient prefers the least amount of testing and invasive procedures possible -She would like to go ahead and do OMT at some point, however he postponed this today because I have a bad cold, she preferred that we wait -Her some neck and shoulder postural exercises -She opted for a prescription for a muscle relaxer for the bad days and she has overdone herself and gets muscle spasm -discussed a healthy diet and safe exercise for weight reduction -Patient advised to return or notify a doctor immediately if symptoms worsen or persist or new concerns arise.  Patient Instructions  We will be in touch after talking with the sports medicine physician, into rescheduling your osteopathic treatment. Thanks for your understanding today.  Do the neck and shoulder exercises as we discussed for posture.  Okay to do water walking and walking her aerobic activity. He did healthy  low sugar diet for weight reduction.  Continues the muscle relaxer at night as needed when you have a flare in the pain.   Colin Benton R., DO

## 2017-03-11 ENCOUNTER — Ambulatory Visit (INDEPENDENT_AMBULATORY_CARE_PROVIDER_SITE_OTHER): Payer: BLUE CROSS/BLUE SHIELD | Admitting: Family Medicine

## 2017-03-11 ENCOUNTER — Telehealth: Payer: Self-pay | Admitting: Family Medicine

## 2017-03-11 ENCOUNTER — Encounter: Payer: Self-pay | Admitting: Family Medicine

## 2017-03-11 VITALS — BP 100/78 | HR 68 | Temp 98.5°F | Ht 65.25 in | Wt 191.0 lb

## 2017-03-11 DIAGNOSIS — M545 Low back pain: Secondary | ICD-10-CM | POA: Diagnosis not present

## 2017-03-11 DIAGNOSIS — M542 Cervicalgia: Secondary | ICD-10-CM

## 2017-03-11 DIAGNOSIS — M25511 Pain in right shoulder: Secondary | ICD-10-CM

## 2017-03-11 DIAGNOSIS — M25512 Pain in left shoulder: Secondary | ICD-10-CM | POA: Diagnosis not present

## 2017-03-11 DIAGNOSIS — G8929 Other chronic pain: Secondary | ICD-10-CM

## 2017-03-11 MED ORDER — TIZANIDINE HCL 2 MG PO CAPS: 2.0000 mg | ORAL_CAPSULE | Freq: Every evening | ORAL | 0 refills | Status: DC | PRN

## 2017-03-11 NOTE — Telephone Encounter (Signed)
Reviewed case, plain films with sports medicine. Appreciative of suggestions including: OMT - without HVLA (ME, CS, MR), PT, Pilates - he forwarded me 7 minute independence from pain video he thought would be helpful for this pt.  Shared information with pt. She wants to start with the neck/shoulder postural exercises I provided, the video and OMT and then consider formal PT as well if not improving with these measures and lifestyle changes for wt reduction.

## 2017-03-11 NOTE — Patient Instructions (Addendum)
We will be in touch after talking with the sports medicine physician, into rescheduling your osteopathic treatment. Thanks for your understanding today.  Do the neck and shoulder exercises as we discussed for posture.  Okay to do water walking and walking her aerobic activity. He did healthy low sugar diet for weight reduction.  Continues the muscle relaxer at night as needed when you have a flare in the pain.

## 2017-03-14 ENCOUNTER — Encounter: Payer: Self-pay | Admitting: Family Medicine

## 2017-03-18 ENCOUNTER — Telehealth: Payer: Self-pay | Admitting: *Deleted

## 2017-03-18 NOTE — Telephone Encounter (Signed)
Per Dr Maudie Mercury I left a detailed message at the pts home number to call to reschedule her visit for OMT any time that is good for her this week if she would like to.

## 2017-04-09 ENCOUNTER — Ambulatory Visit: Payer: BLUE CROSS/BLUE SHIELD | Admitting: Family Medicine

## 2017-06-10 DIAGNOSIS — H35411 Lattice degeneration of retina, right eye: Secondary | ICD-10-CM | POA: Diagnosis not present

## 2017-06-10 DIAGNOSIS — H2513 Age-related nuclear cataract, bilateral: Secondary | ICD-10-CM | POA: Diagnosis not present

## 2017-06-10 DIAGNOSIS — H52203 Unspecified astigmatism, bilateral: Secondary | ICD-10-CM | POA: Diagnosis not present

## 2017-06-10 DIAGNOSIS — H5000 Unspecified esotropia: Secondary | ICD-10-CM | POA: Diagnosis not present

## 2017-06-13 ENCOUNTER — Ambulatory Visit: Payer: BLUE CROSS/BLUE SHIELD | Admitting: Family Medicine

## 2017-07-01 NOTE — Progress Notes (Deleted)
HPI:  Claudia Warner is a pleasant 57 y.o. here for follow up. Chronic medical problems summarized below were reviewed for changes.. ***. Denies CP, SOB, DOE, treatment intolerance or new symptoms.   Back pain: -chronic, intermittent for years - R low back -saw chiropractor in the past, prefers natural treatment for this -no radiation, fevers, malaise, weakness, numbness, change in bowel or bladder function -uses muscle relaxer at times  HLD/Prediabetes/Obesity: -recommended lifestyle changes and consideration statin -she wanted to try lifestyle changes - lab check 02/2017 improved -wt 09/2016 208 --> 02/2017 191 -she has initiated asa 81mg  daily  GERD: -prilosec 20mg  daily -reports symptoms if stops PPI -denies: dysphagia, weight loss, vomiting  Genital herpes: -uses episodic tx rarely with valtrex  Hot Flashes/Fibroids/post menopausal bleeding: -treated with HRT with her gynecologist -seeing gyn for possible endometriosis -sees Juanda Chance annually for gyn physical  Depression: -sees Dr. Toy Care -meds: wellbutrin, lexapro -doing well currently per her report -denies hx hospitalization, SI or any thoughts of self harm  ROS: See pertinent positives and negatives per HPI.  Past Medical History:  Diagnosis Date  . ALLERGIC RHINITIS 09/23/2007   Qualifier: Diagnosis of  By: Lenna Gilford MD, Deborra Medina   . Asthmatic bronchitis    UARS (upper airway resistance syndrome)  . DEPRESSION 09/23/2007   Qualifier: Diagnosis of  By: Lenna Gilford MD, Deborra Medina   . Genital herpes 06/03/2014  . GERD (gastroesophageal reflux disease)   . Hot flashes 06/03/2014  . HYPERCHOLESTEROLEMIA, BORDERLINE 09/23/2007   Qualifier: Diagnosis of  By: Lenna Gilford MD, Deborra Medina   . Palpitations   . Personal history of colonic polyps-adenoma 10/23/2011   Diminutive cecal tubular adenoma 09/2011 Carlean Purl)   . Uterine fibroid     Past Surgical History:  Procedure Laterality Date  . COLONOSCOPY    . WISDOM TOOTH EXTRACTION   1990    Family History  Problem Relation Age of Onset  . Heart disease Father        chf  . Hodgkin's lymphoma Sister   . Colon cancer Maternal Aunt 69  . Heart disease Mother        a fib  . Colon cancer Maternal Aunt   . Uterine cancer Maternal Aunt   . Stomach cancer Neg Hx     Social History   Socioeconomic History  . Marital status: Married    Spouse name: Not on file  . Number of children: Not on file  . Years of education: Not on file  . Highest education level: Not on file  Social Needs  . Financial resource strain: Not on file  . Food insecurity - worry: Not on file  . Food insecurity - inability: Not on file  . Transportation needs - medical: Not on file  . Transportation needs - non-medical: Not on file  Occupational History  . Occupation: Retired  Tobacco Use  . Smoking status: Former Smoker    Packs/day: 0.25    Years: 2.00    Pack years: 0.50    Types: Cigarettes    Last attempt to quit: 06/25/1978    Years since quitting: 39.0  . Smokeless tobacco: Never Used  Substance and Sexual Activity  . Alcohol use: No  . Drug use: No  . Sexual activity: Not on file  Other Topics Concern  . Not on file  Social History Narrative   Work or School: lost her job 10 years ago, stays at home now   No children  1 caffeine daily      Home Situation: lives with husband      Spiritual Beliefs: Christian - [employer]      Lifestyle: walking 3 days per week; diet is ok        Current Outpatient Medications:  .  aspirin 81 MG tablet, Take 81 mg by mouth daily.  , Disp: , Rfl:  .  buPROPion (WELLBUTRIN XL) 150 MG 24 hr tablet, Take 150 mg by mouth daily., Disp: , Rfl:  .  escitalopram (LEXAPRO) 10 MG tablet, Take 5 mg by mouth daily., Disp: , Rfl:  .  Krill Oil 300 MG CAPS, Take 1 capsule by mouth daily.  , Disp: , Rfl:  .  MINIVELLE 0.05 MG/24HR patch, , Disp: , Rfl:  .  omeprazole (PRILOSEC) 20 MG capsule, TAKE 1 CAPSULE DAILY (Patient taking differently:  TAKE 1 CAPSULE AS NEEDED), Disp: 90 capsule, Rfl: 1 .  Probiotic Product ([employer] PROBIOTIC) CAPS, Take by mouth. Pt unaware of strength. Once daily., Disp: , Rfl:  .  progesterone (PROMETRIUM) 100 MG capsule, Take 100 mg by mouth daily., Disp: , Rfl:  .  tizanidine (ZANAFLEX) 2 MG capsule, Take 1 capsule (2 mg total) by mouth at bedtime as needed for muscle spasms., Disp: 20 capsule, Rfl: 0 .  valACYclovir (VALTREX) 1000 MG tablet, daily as needed. Take as directed, Disp: , Rfl:   EXAM:  There were no vitals filed for this visit.  There is no height or weight on file to calculate BMI.  GENERAL: vitals reviewed and listed above, alert, oriented, appears well hydrated and in no acute distress  HEENT: atraumatic, conjunttiva clear, no obvious abnormalities on inspection of external nose and ears  NECK: no obvious masses on inspection  LUNGS: clear to auscultation bilaterally, no wheezes, rales or rhonchi, good air movement  CV: HRRR, no peripheral edema  MS: moves all extremities without noticeable abnormality *** PSYCH: pleasant and cooperative, no obvious depression or anxiety  ASSESSMENT AND PLAN:  Discussed the following assessment and plan:  No diagnosis found.  *** -Patient advised to return or notify a doctor immediately if symptoms worsen or persist or new concerns arise.  There are no Patient Instructions on file for this visit.  Colin Benton R., DO

## 2017-07-02 ENCOUNTER — Ambulatory Visit: Payer: BLUE CROSS/BLUE SHIELD | Admitting: Family Medicine

## 2017-07-04 ENCOUNTER — Encounter: Payer: Self-pay | Admitting: Family Medicine

## 2017-07-15 NOTE — Progress Notes (Deleted)
HPI:  Claudia Warner is a pleasant 57 y.o. here for follow up. Chronic medical problems summarized below were reviewed for changes and stability and were updated as needed below. These issues and their treatment remain stable for the most part. ***. Denies CP, SOB, DOE, treatment intolerance or new symptoms.  Back pain/neck and shoulder pain: -chronic, intermittent for years -saw chiropractor in the past, prefers natural treatment for this -no radiation, fevers, malaise, weakness, numbness, change in bowel or bladder function -hx cts as well per her report  Per xray report lumbar spine multiple views 02/2017: "Lumbar spine scoliosis concave right. 4 mm retrolisthesis L2 on L3 and L3 on L4. No acute bony abnormality identified."  HLD/Prediabetes/Obesity: -recommended lifestyle changes and consideration statin -she wanted to try lifestyle changes - reports is eating a little better and is more active -no regular aerobic exercise -she has lost weight and is happy about this -wt 09/2016 208 --> 02/2017 191 -wants to recheck labs  GERD: -prilosec 20mg  daily -reports symptoms if stops PPI -denies: dysphagia, weight loss, vomiting  Genital herpes: -uses episodic tx rarely with valtrex  Hot Flashes/Fibroids/post menopausal bleeding: -treated with HRT with her gynecologist -seeing gyn for possible endometriosis -sees Juanda Chance annually for gyn physical  Depression: -sees Dr. Toy Care -meds: viibryd -doing well currently per her report -denies hx hospitalization, SI or any thoughts of self harm  ROS: See pertinent positives and negatives per HPI.  Past Medical History:  Diagnosis Date  . ALLERGIC RHINITIS 09/23/2007   Qualifier: Diagnosis of  By: Lenna Gilford MD, Deborra Medina   . Asthmatic bronchitis    UARS (upper airway resistance syndrome)  . DEPRESSION 09/23/2007   Qualifier: Diagnosis of  By: Lenna Gilford MD, Deborra Medina   . Genital herpes 06/03/2014  . GERD (gastroesophageal reflux disease)    . Hot flashes 06/03/2014  . HYPERCHOLESTEROLEMIA, BORDERLINE 09/23/2007   Qualifier: Diagnosis of  By: Lenna Gilford MD, Deborra Medina   . Palpitations   . Personal history of colonic polyps-adenoma 10/23/2011   Diminutive cecal tubular adenoma 09/2011 Carlean Purl)   . Uterine fibroid     Past Surgical History:  Procedure Laterality Date  . COLONOSCOPY    . WISDOM TOOTH EXTRACTION  1990    Family History  Problem Relation Age of Onset  . Heart disease Father        chf  . Hodgkin's lymphoma Sister   . Colon cancer Maternal Aunt 23  . Heart disease Mother        a fib  . Colon cancer Maternal Aunt   . Uterine cancer Maternal Aunt   . Stomach cancer Neg Hx     Social History   Socioeconomic History  . Marital status: Married    Spouse name: Not on file  . Number of children: Not on file  . Years of education: Not on file  . Highest education level: Not on file  Social Needs  . Financial resource strain: Not on file  . Food insecurity - worry: Not on file  . Food insecurity - inability: Not on file  . Transportation needs - medical: Not on file  . Transportation needs - non-medical: Not on file  Occupational History  . Occupation: Retired  Tobacco Use  . Smoking status: Former Smoker    Packs/day: 0.25    Years: 2.00    Pack years: 0.50    Types: Cigarettes    Last attempt to quit: 06/25/1978    Years since quitting: 39.0  .  Smokeless tobacco: Never Used  Substance and Sexual Activity  . Alcohol use: No  . Drug use: No  . Sexual activity: Not on file  Other Topics Concern  . Not on file  Social History Narrative   Work or School: lost her job 10 years ago, stays at home now   No children      1 caffeine daily      Home Situation: lives with husband      Spiritual Beliefs: Christian - [employer]      Lifestyle: walking 3 days per week; diet is ok        Current Outpatient Medications:  .  aspirin 81 MG tablet, Take 81 mg by mouth daily.  , Disp: , Rfl:  .  buPROPion  (WELLBUTRIN XL) 150 MG 24 hr tablet, Take 150 mg by mouth daily., Disp: , Rfl:  .  escitalopram (LEXAPRO) 10 MG tablet, Take 5 mg by mouth daily., Disp: , Rfl:  .  Krill Oil 300 MG CAPS, Take 1 capsule by mouth daily.  , Disp: , Rfl:  .  MINIVELLE 0.05 MG/24HR patch, , Disp: , Rfl:  .  omeprazole (PRILOSEC) 20 MG capsule, TAKE 1 CAPSULE DAILY (Patient taking differently: TAKE 1 CAPSULE AS NEEDED), Disp: 90 capsule, Rfl: 1 .  Probiotic Product ([employer] PROBIOTIC) CAPS, Take by mouth. Pt unaware of strength. Once daily., Disp: , Rfl:  .  progesterone (PROMETRIUM) 100 MG capsule, Take 100 mg by mouth daily., Disp: , Rfl:  .  tizanidine (ZANAFLEX) 2 MG capsule, Take 1 capsule (2 mg total) by mouth at bedtime as needed for muscle spasms., Disp: 20 capsule, Rfl: 0 .  valACYclovir (VALTREX) 1000 MG tablet, daily as needed. Take as directed, Disp: , Rfl:   EXAM:  There were no vitals filed for this visit.  There is no height or weight on file to calculate BMI.  GENERAL: vitals reviewed and listed above, alert, oriented, appears well hydrated and in no acute distress  HEENT: atraumatic, conjunttiva clear, no obvious abnormalities on inspection of external nose and ears  NECK: no obvious masses on inspection  LUNGS: clear to auscultation bilaterally, no wheezes, rales or rhonchi, good air movement  CV: HRRR, no peripheral edema  MS: moves all extremities without noticeable abnormality *** PSYCH: pleasant and cooperative, no obvious depression or anxiety  ASSESSMENT AND PLAN:  Discussed the following assessment and plan:  No diagnosis found.  *** -Patient advised to return or notify a doctor immediately if symptoms worsen or persist or new concerns arise.  There are no Patient Instructions on file for this visit.  Lucretia Kern, DO

## 2017-07-16 ENCOUNTER — Ambulatory Visit: Payer: BLUE CROSS/BLUE SHIELD | Admitting: Family Medicine

## 2017-07-30 DIAGNOSIS — F3342 Major depressive disorder, recurrent, in full remission: Secondary | ICD-10-CM | POA: Diagnosis not present

## 2017-08-21 NOTE — Progress Notes (Signed)
HPI:  Claudia Warner is a pleasant 57 y.o. here for follow up. Chronic medical problems summarized below were reviewed for changes and stability and were updated as needed below. These issues and their treatment remain stable for the most part.  Reports was not as good with her diet and exercise over the holidays and gained some weight.  However she is now getting back on track is exercising again and is trying to eat healthy.  She prefers to check her labs at her next visit.  She was able to get off of the Prilosec for her acid reflux and is only using as needed now. Denies CP, SOB, DOE, treatment intolerance or new symptoms.   HLD/Prediabetes/Obesity: -recommended lifestyle changes and consideration statin -she wanted to try lifestyle changes - preferred to avoid medication - improved dramatically with lifestyle change check 02/2017 -wt 09/2016 208 --> 02/2017 191 --> 195 (07/2017)  GERD: -prilosec 20mg  daily -reports symptoms if stops PPI -denies: dysphagia, weight loss, vomiting  Genital herpes: -uses episodic tx rarely with valtrex  Hot Flashes/Fibroids/post menopausal bleeding: -treated with HRT with her gynecologist -seeing gyn for possible endometriosis -sees Juanda Chance annually for gyn physical  Depression: -sees Dr. Toy Care -meds: viibryd -doing well currently per her report -denies hx hospitalization, SI or any thoughts of self harm  ROS: See pertinent positives and negatives per HPI.  Past Medical History:  Diagnosis Date  . ALLERGIC RHINITIS 09/23/2007   Qualifier: Diagnosis of  By: Lenna Gilford MD, Deborra Medina   . Asthmatic bronchitis    UARS (upper airway resistance syndrome)  . DEPRESSION 09/23/2007   Qualifier: Diagnosis of  By: Lenna Gilford MD, Deborra Medina   . Genital herpes 06/03/2014  . GERD (gastroesophageal reflux disease)   . Hot flashes 06/03/2014  . HYPERCHOLESTEROLEMIA, BORDERLINE 09/23/2007   Qualifier: Diagnosis of  By: Lenna Gilford MD, Deborra Medina   . Palpitations   . Personal  history of colonic polyps-adenoma 10/23/2011   Diminutive cecal tubular adenoma 09/2011 Carlean Purl)   . Uterine fibroid     Past Surgical History:  Procedure Laterality Date  . COLONOSCOPY    . WISDOM TOOTH EXTRACTION  1990    Family History  Problem Relation Age of Onset  . Heart disease Father        chf  . Hodgkin's lymphoma Sister   . Colon cancer Maternal Aunt 62  . Heart disease Mother        a fib  . Colon cancer Maternal Aunt   . Uterine cancer Maternal Aunt   . Stomach cancer Neg Hx     Social History   Socioeconomic History  . Marital status: Married    Spouse name: None  . Number of children: None  . Years of education: None  . Highest education level: None  Social Needs  . Financial resource strain: None  . Food insecurity - worry: None  . Food insecurity - inability: None  . Transportation needs - medical: None  . Transportation needs - non-medical: None  Occupational History  . Occupation: Retired  Tobacco Use  . Smoking status: Former Smoker    Packs/day: 0.25    Years: 2.00    Pack years: 0.50    Types: Cigarettes    Last attempt to quit: 06/25/1978    Years since quitting: 39.1  . Smokeless tobacco: Never Used  Substance and Sexual Activity  . Alcohol use: No  . Drug use: No  . Sexual activity: None  Other Topics Concern  .  None  Social History Narrative   Work or School: lost her job 10 years ago, stays at home now   No children      1 caffeine daily      Home Situation: lives with husband      Spiritual Beliefs: Christian - [employer]      Lifestyle: walking 3 days per week; diet is ok        Current Outpatient Medications:  .  aspirin 81 MG tablet, Take 81 mg by mouth daily.  , Disp: , Rfl:  .  buPROPion (WELLBUTRIN XL) 150 MG 24 hr tablet, Take 150 mg by mouth daily., Disp: , Rfl:  .  escitalopram (LEXAPRO) 10 MG tablet, Take 5 mg by mouth daily., Disp: , Rfl:  .  MINIVELLE 0.05 MG/24HR patch, , Disp: , Rfl:  .  omeprazole  (PRILOSEC) 20 MG capsule, TAKE 1 CAPSULE DAILY (Patient taking differently: TAKE 1 CAPSULE AS NEEDED), Disp: 90 capsule, Rfl: 1 .  Probiotic Product ([employer] PROBIOTIC) CAPS, Take by mouth. Pt unaware of strength. Once daily., Disp: , Rfl:  .  progesterone (PROMETRIUM) 100 MG capsule, Take 100 mg by mouth daily., Disp: , Rfl:  .  valACYclovir (VALTREX) 1000 MG tablet, daily as needed. Take as directed, Disp: , Rfl:   EXAM:  Vitals:   08/22/17 0846  BP: 90/60  Pulse: 75  Temp: 98.3 F (36.8 C)    Body mass index is 32.3 kg/m.  GENERAL: vitals reviewed and listed above, alert, oriented, appears well hydrated and in no acute distress  HEENT: atraumatic, conjunttiva clear, no obvious abnormalities on inspection of external nose and ears  NECK: no obvious masses on inspection  LUNGS: clear to auscultation bilaterally, no wheezes, rales or rhonchi, good air movement  CV: HRRR, no peripheral edema  MS: moves all extremities without noticeable abnormality  PSYCH: pleasant and cooperative, no obvious depression or anxiety  ASSESSMENT AND PLAN:  Discussed the following assessment and plan:  Hyperlipidemia, unspecified hyperlipidemia type  Recurrent major depressive disorder, in full remission (HCC)  Gastroesophageal reflux disease, esophagitis presence not specified  BMI 32.0-32.9,adult  -Congratulated that she has gotten back on track with lifestyle changes and encouraged her to continue with a goal to lose about 10-20 pounds over the next 4 months -Glad that she now is able to use the Prilosec on an as-needed basis rather than every day -We will plan to do her labs at her next visit -See patient instructions  Patient Instructions  BEFORE YOU LEAVE: -follow up: 4 months - labs then, come fasting   We recommend the following healthy lifestyle for LIFE: 1) Small portions. But, make sure to get regular (at least 3 per day), healthy meals and small healthy snacks if  needed.  2) Eat a healthy clean diet.   TRY TO EAT: -at least 5-7 servings of low sugar, colorful, and nutrient rich vegetables per day (not corn, potatoes or bananas.) -berries are the best choice if you wish to eat fruit (only eat small amounts if trying to reduce weight)  -lean meets (fish, white meat of chicken or [place]) -vegan proteins for some meals - beans or tofu, whole grains, nuts and seeds -Replace bad fats with good fats - good fats include: fish, nuts and seeds, canola oil, olive oil -small amounts of low fat or non fat dairy -small amounts of100 % whole grains - check the lables -drink plenty of water  AVOID: -SUGAR, sweets, anything with added sugar, corn  syrup or sweeteners - must read labels as even foods advertised as "healthy" often are loaded with sugar -if you must have a sweetener, small amounts of stevia may be best -sweetened beverages and artificially sweetened beverages -simple starches (rice, bread, potatoes, pasta, chips, etc - small amounts of 100% whole grains are ok) -red meat, pork, butter -fried foods, fast food, processed food, excessive dairy, eggs and coconut.  3)Get at least 150 minutes of sweaty aerobic exercise per week.  4)Reduce stress - consider counseling, meditation and relaxation to balance other aspects of your life.     Lucretia Kern, DO

## 2017-08-22 ENCOUNTER — Ambulatory Visit (INDEPENDENT_AMBULATORY_CARE_PROVIDER_SITE_OTHER): Payer: BLUE CROSS/BLUE SHIELD | Admitting: Family Medicine

## 2017-08-22 ENCOUNTER — Encounter: Payer: Self-pay | Admitting: Family Medicine

## 2017-08-22 VITALS — BP 90/60 | HR 75 | Temp 98.3°F | Ht 65.25 in | Wt 195.6 lb

## 2017-08-22 DIAGNOSIS — Z6832 Body mass index (BMI) 32.0-32.9, adult: Secondary | ICD-10-CM

## 2017-08-22 DIAGNOSIS — E785 Hyperlipidemia, unspecified: Secondary | ICD-10-CM | POA: Diagnosis not present

## 2017-08-22 DIAGNOSIS — K219 Gastro-esophageal reflux disease without esophagitis: Secondary | ICD-10-CM

## 2017-08-22 DIAGNOSIS — F3342 Major depressive disorder, recurrent, in full remission: Secondary | ICD-10-CM

## 2017-08-22 NOTE — Patient Instructions (Signed)
BEFORE YOU LEAVE: -follow up: 4 months - labs then, come fasting   We recommend the following healthy lifestyle for LIFE: 1) Small portions. But, make sure to get regular (at least 3 per day), healthy meals and small healthy snacks if needed.  2) Eat a healthy clean diet.   TRY TO EAT: -at least 5-7 servings of low sugar, colorful, and nutrient rich vegetables per day (not corn, potatoes or bananas.) -berries are the best choice if you wish to eat fruit (only eat small amounts if trying to reduce weight)  -lean meets (fish, white meat of chicken or [place]) -vegan proteins for some meals - beans or tofu, whole grains, nuts and seeds -Replace bad fats with good fats - good fats include: fish, nuts and seeds, canola oil, olive oil -small amounts of low fat or non fat dairy -small amounts of100 % whole grains - check the lables -drink plenty of water  AVOID: -SUGAR, sweets, anything with added sugar, corn syrup or sweeteners - must read labels as even foods advertised as "healthy" often are loaded with sugar -if you must have a sweetener, small amounts of stevia may be best -sweetened beverages and artificially sweetened beverages -simple starches (rice, bread, potatoes, pasta, chips, etc - small amounts of 100% whole grains are ok) -red meat, pork, butter -fried foods, fast food, processed food, excessive dairy, eggs and coconut.  3)Get at least 150 minutes of sweaty aerobic exercise per week.  4)Reduce stress - consider counseling, meditation and relaxation to balance other aspects of your life.

## 2017-12-23 ENCOUNTER — Ambulatory Visit: Payer: BLUE CROSS/BLUE SHIELD | Admitting: Family Medicine

## 2018-01-30 ENCOUNTER — Ambulatory Visit (INDEPENDENT_AMBULATORY_CARE_PROVIDER_SITE_OTHER): Payer: BLUE CROSS/BLUE SHIELD | Admitting: Family Medicine

## 2018-01-30 ENCOUNTER — Encounter: Payer: Self-pay | Admitting: Family Medicine

## 2018-01-30 VITALS — BP 90/70 | HR 64 | Temp 98.2°F | Ht 65.25 in | Wt 209.7 lb

## 2018-01-30 DIAGNOSIS — E785 Hyperlipidemia, unspecified: Secondary | ICD-10-CM

## 2018-01-30 DIAGNOSIS — F3342 Major depressive disorder, recurrent, in full remission: Secondary | ICD-10-CM | POA: Diagnosis not present

## 2018-01-30 DIAGNOSIS — R739 Hyperglycemia, unspecified: Secondary | ICD-10-CM

## 2018-01-30 DIAGNOSIS — E669 Obesity, unspecified: Secondary | ICD-10-CM | POA: Diagnosis not present

## 2018-01-30 NOTE — Patient Instructions (Signed)
BEFORE YOU LEAVE: -follow up: 3 months for physical exam with labs  Eat a healthy clean, low sugar diet and get regular exercise. Shoot for a 10 lb weight reduction over the next 3 months.  Flu shot in october    We recommend the following healthy lifestyle for LIFE: 1) Small portions. But, make sure to get regular (at least 3 per day), healthy meals and small healthy snacks if needed.  2) Eat a healthy clean diet.   TRY TO EAT: -at least 5-7 servings of low sugar, colorful, and nutrient rich vegetables per day (not corn, potatoes or bananas.) -berries are the best choice if you wish to eat fruit (only eat small amounts if trying to reduce weight)  -lean meets (fish, white meat of chicken or [place]) -vegan proteins for some meals - beans or tofu, whole grains, nuts and seeds -Replace bad fats with good fats - good fats include: fish, nuts and seeds, canola oil, olive oil -small amounts of low fat or non fat dairy -small amounts of100 % whole grains - check the lables -drink plenty of water  AVOID: -SUGAR, sweets, anything with added sugar, corn syrup or sweeteners - must read labels as even foods advertised as "healthy" often are loaded with sugar -if you must have a sweetener, small amounts of stevia may be best -sweetened beverages and artificially sweetened beverages -simple starches (rice, bread, potatoes, pasta, chips, etc - small amounts of 100% whole grains are ok) -red meat, pork, butter -fried foods, fast food, processed food, excessive dairy, eggs and coconut.  3)Get at least 150 minutes of sweaty aerobic exercise per week.  4)Reduce stress - consider counseling, meditation and relaxation to balance other aspects of your life.

## 2018-01-30 NOTE — Progress Notes (Signed)
HPI:  Using dictation device. Unfortunately this device frequently misinterprets words/phrases.  Claudia Warner is a pleasant 57 y.o. here for follow up. Chronic medical problems summarized below were reviewed for changes and stability and were updated as needed below. These issues and their treatment remain stable for the most part.  Reports she is doing well and has no complaints today.  Unfortunately she admits that her diet and lifestyle have "not been good".  No regular exercise.  She is concerned that she has gained some weight.  She wants to make changes to improve her health and the health of her spouse.   She continues to see Dr. Robina Ade for her depression.  Reports she is doing well.  See PHQ 9.   She takes an aspirin daily.  Reports she has taken this for a long time.  She is not sure why she takes it.  Denies any history of heart disease, stroke or clotting disorder.  Denies CP, SOB, DOE, treatment intolerance or new symptoms.  HLD/Prediabetes/Obesity: -recommended lifestyle changes and consideration statin -she wanted to try lifestyle changes- preferred to avoid medication - improved dramatically with lifestyle change check 02/2017 -wt 09/2016 208 --> 02/2017 191 --> 195 (07/2017) --> 209 (8/19)  GERD: -prilosec 20mg  daily prn -denies: dysphagia, weight loss, vomiting  Genital herpes: -uses episodic tx rarely with valtrex  Hot Flashes/Fibroids/post menopausal bleeding: -treated with HRT with her gynecologist -seeing gyn for possible endometriosis -sees Juanda Chance annually for gyn physical  Depression: -sees Dr. Toy Care -meds: Wellbutrin, Lexapro -doing well currently per her report -denies hx hospitalization, SI or any thoughts of self harm   ROS: See pertinent positives and negatives per HPI.  Past Medical History:  Diagnosis Date  . ALLERGIC RHINITIS 09/23/2007   Qualifier: Diagnosis of  By: Lenna Gilford MD, Deborra Medina   . Asthmatic bronchitis    UARS (upper airway resistance  syndrome)  . DEPRESSION 09/23/2007   Qualifier: Diagnosis of  By: Lenna Gilford MD, Deborra Medina   . Genital herpes 06/03/2014  . GERD (gastroesophageal reflux disease)   . Hot flashes 06/03/2014  . HYPERCHOLESTEROLEMIA, BORDERLINE 09/23/2007   Qualifier: Diagnosis of  By: Lenna Gilford MD, Deborra Medina   . Palpitations   . Personal history of colonic polyps-adenoma 10/23/2011   Diminutive cecal tubular adenoma 09/2011 Carlean Purl)   . Uterine fibroid     Past Surgical History:  Procedure Laterality Date  . COLONOSCOPY    . WISDOM TOOTH EXTRACTION  1990    Family History  Problem Relation Age of Onset  . Heart disease Father        chf  . Hodgkin's lymphoma Sister   . Colon cancer Maternal Aunt 63  . Heart disease Mother        a fib  . Colon cancer Maternal Aunt   . Uterine cancer Maternal Aunt   . Stomach cancer Neg Hx     SOCIAL HX: See HPI   Current Outpatient Medications:  .  buPROPion (WELLBUTRIN XL) 150 MG 24 hr tablet, Take 150 mg by mouth daily., Disp: , Rfl:  .  escitalopram (LEXAPRO) 10 MG tablet, Take 5 mg by mouth daily., Disp: , Rfl:  .  MINIVELLE 0.05 MG/24HR patch, , Disp: , Rfl:  .  omeprazole (PRILOSEC) 20 MG capsule, TAKE 1 CAPSULE DAILY (Patient taking differently: Take 20 mg by mouth daily as needed. ), Disp: 90 capsule, Rfl: 1 .  Probiotic Product ([employer]S PROBIOTIC) CAPS, Take by mouth. Pt unaware of strength. Once  daily., Disp: , Rfl:  .  progesterone (PROMETRIUM) 100 MG capsule, Take 100 mg by mouth daily., Disp: , Rfl:  .  valACYclovir (VALTREX) 1000 MG tablet, daily as needed. Take as directed, Disp: , Rfl:   EXAM:  Vitals:   01/30/18 1000  BP: 90/70  Pulse: 64  Temp: 98.2 F (36.8 C)    Body mass index is 34.63 kg/m.  GENERAL: vitals reviewed and listed above, alert, oriented, appears well hydrated and in no acute distress  HEENT: atraumatic, conjunttiva clear, no obvious abnormalities on inspection of external nose and ears  NECK: no obvious masses on  inspection  LUNGS: clear to auscultation bilaterally, no wheezes, rales or rhonchi, good air movement  CV: HRRR, no peripheral edema  MS: moves all extremities without noticeable abnormality  PSYCH: pleasant and cooperative, no obvious depression or anxiety  ASSESSMENT AND PLAN:  Discussed the following assessment and plan:  Obesity (BMI 30.0-34.9)  Hyperlipidemia, unspecified hyperlipidemia type  Hyperglycemia  Recurrent major depressive disorder, in full remission ([place])  -Spent a long time discussing a healthy diet, regular exercise and stress management.  With with commitment to healthy habits in these 3 areas I think her health could greatly improved.  We decided on a goal of a 10 pound weight reduction over the next 3 to 6 months. -Discussed the risk and benefits of aspirin therapy and decided to stop this for now. -I am glad that it seems her depression is well controlled.  See PHQ 9.  She sees Dr. Robina Ade for management. -Physical in 3 months, follow-up sooner as needed.   Patient Instructions  BEFORE YOU LEAVE: -follow up: 3 months for physical exam with labs  Eat a healthy clean, low sugar diet and get regular exercise. Shoot for a 10 lb weight reduction over the next 3 months.  Flu shot in october    We recommend the following healthy lifestyle for LIFE: 1) Small portions. But, make sure to get regular (at least 3 per day), healthy meals and small healthy snacks if needed.  2) Eat a healthy clean diet.   TRY TO EAT: -at least 5-7 servings of low sugar, colorful, and nutrient rich vegetables per day (not corn, potatoes or bananas.) -berries are the best choice if you wish to eat fruit (only eat small amounts if trying to reduce weight)  -lean meets (fish, white meat of chicken or [place]) -vegan proteins for some meals - beans or tofu, whole grains, nuts and seeds -Replace bad fats with good fats - good fats include: fish, nuts and seeds, canola oil, olive  oil -small amounts of low fat or non fat dairy -small amounts of100 % whole grains - check the lables -drink plenty of water  AVOID: -SUGAR, sweets, anything with added sugar, corn syrup or sweeteners - must read labels as even foods advertised as "healthy" often are loaded with sugar -if you must have a sweetener, small amounts of stevia may be best -sweetened beverages and artificially sweetened beverages -simple starches (rice, bread, potatoes, pasta, chips, etc - small amounts of 100% whole grains are ok) -red meat, pork, butter -fried foods, fast food, processed food, excessive dairy, eggs and coconut.  3)Get at least 150 minutes of sweaty aerobic exercise per week.  4)Reduce stress - consider counseling, meditation and relaxation to balance other aspects of your life.     Lucretia Kern, DO

## 2018-02-19 DIAGNOSIS — Z1231 Encounter for screening mammogram for malignant neoplasm of breast: Secondary | ICD-10-CM | POA: Diagnosis not present

## 2018-02-19 DIAGNOSIS — Z13820 Encounter for screening for osteoporosis: Secondary | ICD-10-CM | POA: Diagnosis not present

## 2018-02-19 DIAGNOSIS — Z6834 Body mass index (BMI) 34.0-34.9, adult: Secondary | ICD-10-CM | POA: Diagnosis not present

## 2018-02-19 DIAGNOSIS — Z01419 Encounter for gynecological examination (general) (routine) without abnormal findings: Secondary | ICD-10-CM | POA: Diagnosis not present

## 2018-04-29 DIAGNOSIS — Z23 Encounter for immunization: Secondary | ICD-10-CM | POA: Diagnosis not present

## 2018-05-05 ENCOUNTER — Ambulatory Visit: Payer: BLUE CROSS/BLUE SHIELD | Admitting: Family Medicine

## 2018-05-31 NOTE — Progress Notes (Signed)
HPI:  Using dictation device. Unfortunately this device frequently misinterprets words/phrases.  Claudia Warner is a pleasant 57 y.o. here for follow up. Chronic medical problems summarized below were reviewed for changes and stability and were updated as needed below. These issues and their treatment remain stable for the most part. Denies CP, SOB, DOE, treatment intolerance or new symptoms. Doing well for the most part. Parents in their 27s with health issues recently - resulted in her diet not as good recently.   HLD/Prediabetes/Obesity: -recommended lifestyle changes and consideration statin -she wanted to try lifestyle changes-preferred to avoid medication - improved dramatically with lifestyle change check 02/2017 -wt 09/2016 208 --> 02/2017 191--> 195 (07/2017) --> 209 (8/19) --> 205 12/19  GERD: -prilosec 20mg  daily prn -denies: dysphagia, weight loss, vomiting  Genital herpes: -uses episodic tx rarely with valtrex  Hot Flashes/Fibroids/post menopausal bleeding: -treated with HRT with her gynecologist -seeing gyn for possible endometriosis -sees Juanda Chance annually for gyn physical  Depression: -sees Dr. Toy Care -meds: Wellbutrin, Lexapro -doing well currently per her report -denies hx hospitalization, SI or any thoughts of self harm   ROS: See pertinent positives and negatives per HPI.  Past Medical History:  Diagnosis Date  . ALLERGIC RHINITIS 09/23/2007   Qualifier: Diagnosis of  By: Lenna Gilford MD, Deborra Medina   . Asthmatic bronchitis    UARS (upper airway resistance syndrome)  . DEPRESSION 09/23/2007   Qualifier: Diagnosis of  By: Lenna Gilford MD, Deborra Medina   . Genital herpes 06/03/2014  . GERD (gastroesophageal reflux disease)   . Hot flashes 06/03/2014  . HYPERCHOLESTEROLEMIA, BORDERLINE 09/23/2007   Qualifier: Diagnosis of  By: Lenna Gilford MD, Deborra Medina   . Palpitations   . Personal history of colonic polyps-adenoma 10/23/2011   Diminutive cecal tubular adenoma 09/2011 Carlean Purl)    . Uterine fibroid     Past Surgical History:  Procedure Laterality Date  . COLONOSCOPY    . WISDOM TOOTH EXTRACTION  1990    Family History  Problem Relation Age of Onset  . Heart disease Father        chf  . Hodgkin's lymphoma Sister   . Colon cancer Maternal Aunt 61  . Heart disease Mother        a fib  . Colon cancer Maternal Aunt   . Uterine cancer Maternal Aunt   . Stomach cancer Neg Hx     SOCIAL HX: see hpi   Current Outpatient Medications:  .  buPROPion (WELLBUTRIN XL) 150 MG 24 hr tablet, Take 150 mg by mouth daily., Disp: , Rfl:  .  escitalopram (LEXAPRO) 10 MG tablet, Take 5 mg by mouth daily., Disp: , Rfl:  .  MINIVELLE 0.05 MG/24HR patch, , Disp: , Rfl:  .  omeprazole (PRILOSEC) 20 MG capsule, TAKE 1 CAPSULE DAILY (Patient taking differently: Take 20 mg by mouth daily as needed. ), Disp: 90 capsule, Rfl: 1 .  Probiotic Product ([employer]S PROBIOTIC) CAPS, Take by mouth. Pt unaware of strength. Once daily., Disp: , Rfl:  .  progesterone (PROMETRIUM) 100 MG capsule, Take 100 mg by mouth daily., Disp: , Rfl:  .  valACYclovir (VALTREX) 1000 MG tablet, daily as needed. Take as directed, Disp: , Rfl:   EXAM:  Vitals:   06/02/18 0857  BP: 100/64  Pulse: 88  Temp: 98.2 F (36.8 C)    Body mass index is 33.94 kg/m.  GENERAL: vitals reviewed and listed above, alert, oriented, appears well hydrated and in no acute distress  HEENT: atraumatic,  conjunttiva clear, no obvious abnormalities on inspection of external nose and ears  NECK: no obvious masses on inspection  LUNGS: clear to auscultation bilaterally, no wheezes, rales or rhonchi, good air movement  CV: HRRR, no peripheral edema  MS: moves all extremities without noticeable abnormality  PSYCH: pleasant and cooperative, no obvious depression or anxiety  ASSESSMENT AND PLAN:  Discussed the following assessment and plan:  Hyperlipidemia, unspecified hyperlipidemia type - Plan: Lipid  panel  Hyperglycemia - Plan: Hemoglobin A1c  Obesity (BMI 30.0-34.9)  Recurrent major depressive disorder, in full remission ([place])  -labs per orders -lifestyle recs, supported in current trials -cont current treatment o/w -CPE 4 months, follow up as needed in interim  There are no Patient Instructions on file for this visit.  Lucretia Kern, DO

## 2018-06-02 ENCOUNTER — Ambulatory Visit (INDEPENDENT_AMBULATORY_CARE_PROVIDER_SITE_OTHER): Payer: BLUE CROSS/BLUE SHIELD | Admitting: Family Medicine

## 2018-06-02 ENCOUNTER — Encounter: Payer: Self-pay | Admitting: Family Medicine

## 2018-06-02 VITALS — BP 100/64 | HR 88 | Temp 98.2°F | Ht 65.25 in | Wt 205.5 lb

## 2018-06-02 DIAGNOSIS — E669 Obesity, unspecified: Secondary | ICD-10-CM

## 2018-06-02 DIAGNOSIS — F3342 Major depressive disorder, recurrent, in full remission: Secondary | ICD-10-CM | POA: Diagnosis not present

## 2018-06-02 DIAGNOSIS — R739 Hyperglycemia, unspecified: Secondary | ICD-10-CM

## 2018-06-02 DIAGNOSIS — E785 Hyperlipidemia, unspecified: Secondary | ICD-10-CM | POA: Diagnosis not present

## 2018-06-02 LAB — HEMOGLOBIN A1C: Hgb A1c MFr Bld: 5.7 % (ref 4.6–6.5)

## 2018-06-02 LAB — LIPID PANEL
CHOL/HDL RATIO: 4
Cholesterol: 204 mg/dL — ABNORMAL HIGH (ref 0–200)
HDL: 47.2 mg/dL (ref >39.00)
LDL Cholesterol: 127 mg/dL — ABNORMAL HIGH (ref 0–99)
NonHDL: 156.38
Triglycerides: 145 mg/dL (ref 0.0–149.0)
VLDL: 29 mg/dL (ref 0.0–40.0)

## 2018-06-02 NOTE — Patient Instructions (Signed)
BEFORE YOU LEAVE: -labs -follow up: CPE in 4 months  We have ordered labs or studies at this visit. It can take up to 1-2 weeks for results and processing. IF results require follow up or explanation, we will call you with instructions. Clinically stable results will be released to your [facility]. If you have not heard from [place] or cannot find your results in [facility] in 2 weeks please contact our office at [phone].  If you are not yet signed up for [facility], please consider signing up.   We recommend the following healthy lifestyle for LIFE: 1) Small portions. But, make sure to get regular (at least 3 per day), healthy meals and small healthy snacks if needed.  2) Eat a healthy clean diet.   TRY TO EAT: -at least 5-7 servings of low sugar, colorful, and nutrient rich vegetables per day (not corn, potatoes or bananas.) -berries are the best choice if you wish to eat fruit (only eat small amounts if trying to reduce weight)  -lean meets (fish, white meat of chicken or [place]) -vegan proteins for some meals - beans or tofu, whole grains, nuts and seeds -Replace bad fats with good fats - good fats include: fish, nuts and seeds, canola oil, olive oil -small amounts of low fat or non fat dairy -small amounts of100 % whole grains - check the lables -drink plenty of water  AVOID: -SUGAR, sweets, anything with added sugar, corn syrup or sweeteners - must read labels as even foods advertised as "healthy" often are loaded with sugar -if you must have a sweetener, small amounts of stevia may be best -sweetened beverages and artificially sweetened beverages -simple starches (rice, bread, potatoes, pasta, chips, etc - small amounts of 100% whole grains are ok) -red meat, pork, butter -fried foods, fast food, processed food, excessive dairy, eggs and coconut.  3)Get at least 150 minutes of sweaty aerobic exercise per week.  4)Reduce stress - consider counseling, meditation and relaxation to balance  other aspects of your life.

## 2018-07-28 DIAGNOSIS — F3342 Major depressive disorder, recurrent, in full remission: Secondary | ICD-10-CM | POA: Diagnosis not present

## 2018-07-28 DIAGNOSIS — F411 Generalized anxiety disorder: Secondary | ICD-10-CM | POA: Diagnosis not present

## 2018-07-30 DIAGNOSIS — H35411 Lattice degeneration of retina, right eye: Secondary | ICD-10-CM | POA: Diagnosis not present

## 2018-07-30 DIAGNOSIS — H52203 Unspecified astigmatism, bilateral: Secondary | ICD-10-CM | POA: Diagnosis not present

## 2018-08-15 ENCOUNTER — Ambulatory Visit (INDEPENDENT_AMBULATORY_CARE_PROVIDER_SITE_OTHER): Payer: BLUE CROSS/BLUE SHIELD | Admitting: Internal Medicine

## 2018-08-15 ENCOUNTER — Encounter: Payer: Self-pay | Admitting: Internal Medicine

## 2018-08-15 VITALS — BP 112/62 | HR 70 | Temp 98.5°F | Ht 65.25 in | Wt 213.2 lb

## 2018-08-15 DIAGNOSIS — M542 Cervicalgia: Secondary | ICD-10-CM | POA: Diagnosis not present

## 2018-08-15 DIAGNOSIS — M62838 Other muscle spasm: Secondary | ICD-10-CM

## 2018-08-15 MED ORDER — METAXALONE 800 MG PO TABS: 800.0000 mg | ORAL_TABLET | Freq: Three times a day (TID) | ORAL | 0 refills | Status: DC

## 2018-08-15 NOTE — Patient Instructions (Signed)
I agree this is muscle neck pain and not alarming  .    But advise gentle stretching exercises  Attend to neck hygiene .   Can try muscle relaxant at night  But   dont have to do this   As no change in long term  Outcomes.     Acute Torticollis, Adult Torticollis is a condition in which the muscles of the neck tighten (contract) abnormally, causing the neck to twist and the head to move into an unnatural position. Torticollis that develops suddenly is called acute torticollis. People with acute torticollis may have trouble turning their head. The condition can be painful and may range from mild to severe. What are the causes? This condition may be caused by:  Sleeping in an awkward position (common).  Extending or twisting the neck muscles beyond their normal position.  An injury to the neck muscles.  An infection.  A tumor.  Certain medicines.  Long-lasting spasms of the neck muscles. In some cases, the cause may not be known. What increases the risk? You are more likely to develop this condition if:  You have a condition associated with loose ligaments, such as Down syndrome.  You have a brain condition that affects vision, such as strabismus. What are the signs or symptoms? The main symptom of this condition is tilting of the head to one side. Other symptoms include:  Pain in the neck.  Trouble turning the head from side to side or up and down. How is this diagnosed? This condition may be diagnosed based on:  A physical exam.  Your medical history.  Imaging tests, such as: ? An X-ray. ? An ultrasound. ? A CT scan. ? An MRI. How is this treated? Treatment for this condition depends on what is causing the condition. Mild cases may go away without treatment. Treatment for more serious cases may include:  Medicines or shots to relax the muscles.  Other medicines, such as antibiotics to treat the underlying cause.  Wearing a soft neck collar.  Physical therapy  and stretching to improve neck strength and flexibility.  Neck massage. In severe cases, surgery may be needed to repair dislocated or broken bones or to treat nerves in the neck. Follow these instructions at home:   Take over-the-counter and prescription medicines only as told by your health care provider.  Do stretching exercises and massage your neck as told by your health care provider.  If directed, apply heat to the affected area as often as told by your health care provider. Use the heat source that your health care provider recommends, such as a moist heat pack or a heating pad. ? Place a towel between your skin and the heat source. ? Leave the heat on for 20-30 minutes. ? Remove the heat if your skin turns bright red. This is especially important if you are unable to feel pain, heat, or cold. You may have a greater risk of getting burned.  If you wake up with torticollis after sleeping, check your bed or sleeping area. Look for lumpy pillows or unusual objects. Make sure your bed and sleeping area are comfortable.  Keep all follow-up visits as told by your health care provider. This is important. Contact a health care provider if:  You have a fever.  Your symptoms do not improve or they get worse. Get help right away if:  You have trouble breathing.  You develop noisy breathing (stridor).  You start to drool.  You have trouble  swallowing or pain when swallowing.  You develop numbness or weakness in your hands or feet.  You have changes in your speech, understanding, or vision.  You are in severe pain.  You cannot move your head or neck. Summary  Torticollis is a condition in which the muscles of the neck tighten (contract) abnormally, causing the neck to twist and the head to move into an unnatural position. Torticollis that develops suddenly is called acute torticollis.  Treatment for this condition depends on what is causing the condition. Mild cases may go  away without treatment.  Do stretching exercises and massage your neck as told by your health care provider. You may also be instructed to apply heat to the area.  Contact your health care provider if your symptoms do not improve or they get worse. This information is not intended to replace advice given to you by your health care provider. Make sure you discuss any questions you have with your health care provider. Document Released: 06/08/2000 Document Revised: 08/09/2016 Document Reviewed: 08/09/2016 Elsevier Interactive Patient Education  2019 [employer].

## 2018-08-15 NOTE — Progress Notes (Signed)
Chief Complaint  Patient presents with  . Neck Pain    patient complains of neck pain and tightness x1 day, no known injury, tried heat and Ibuprofen    HPI: Claudia Warner 58 y.o. come in for SDA   PCP APPT ? NA  Onset early afternoon   Reached for something and then locked up  And sick pain and some better   oibuprofen and heat trial    Tighter  And movement gets spasm there is no numbness weakness in her arm thinks that she has just moved the wrong way and has had poor neck hygiene recently.  Is a little bit better today moving around but is still pain and stiff on the right side.   Past hx of same  Tension  In past   Exp  Office job.    ROS: See pertinent positives and negatives per HPI.  No fever unusual headaches but does cause a headache no arm or leg symptoms with this.  Past Medical History:  Diagnosis Date  . ALLERGIC RHINITIS 09/23/2007   Qualifier: Diagnosis of  By: Lenna Gilford MD, Deborra Medina   . Asthmatic bronchitis    UARS (upper airway resistance syndrome)  . DEPRESSION 09/23/2007   Qualifier: Diagnosis of  By: Lenna Gilford MD, Deborra Medina   . Genital herpes 06/03/2014  . GERD (gastroesophageal reflux disease)   . Hot flashes 06/03/2014  . HYPERCHOLESTEROLEMIA, BORDERLINE 09/23/2007   Qualifier: Diagnosis of  By: Lenna Gilford MD, Deborra Medina   . Palpitations   . Personal history of colonic polyps-adenoma 10/23/2011   Diminutive cecal tubular adenoma 09/2011 Carlean Purl)   . Uterine fibroid     Family History  Problem Relation Age of Onset  . Heart disease Father        chf  . Hodgkin's lymphoma Sister   . Colon cancer Maternal Aunt 2  . Heart disease Mother        a fib  . Colon cancer Maternal Aunt   . Uterine cancer Maternal Aunt   . Stomach cancer Neg Hx     Social History   Socioeconomic History  . Marital status: Married    Spouse name: Not on file  . Number of children: Not on file  . Years of education: Not on file  . Highest education level: Not on file  Occupational History   . Occupation: Retired  Scientific laboratory technician  . Financial resource strain: Not on file  . Food insecurity:    Worry: Not on file    Inability: Not on file  . Transportation needs:    Medical: Not on file    Non-medical: Not on file  Tobacco Use  . Smoking status: Former Smoker    Packs/day: 0.25    Years: 2.00    Pack years: 0.50    Types: Cigarettes    Last attempt to quit: 06/25/1978    Years since quitting: 40.1  . Smokeless tobacco: Never Used  Substance and Sexual Activity  . Alcohol use: No  . Drug use: No  . Sexual activity: Not on file  Lifestyle  . Physical activity:    Days per week: Not on file    Minutes per session: Not on file  . Stress: Not on file  Relationships  . Social connections:    Talks on phone: Not on file    Gets together: Not on file    Attends religious service: Not on file    Active member of club or  organization: Not on file    Attends meetings of clubs or organizations: Not on file    Relationship status: Not on file  Other Topics Concern  . Not on file  Social History Narrative   Work or School: lost her job 10 years ago, stays at home now   No children      1 caffeine daily      Home Situation: lives with husband      Spiritual Beliefs: Christian - [employer]      Lifestyle: walking 3 days per week; diet is ok       Outpatient Medications Prior to Visit  Medication Sig Dispense Refill  . buPROPion (WELLBUTRIN XL) 150 MG 24 hr tablet Take 150 mg by mouth daily.    Marland Kitchen escitalopram (LEXAPRO) 10 MG tablet Take 5 mg by mouth daily.    Marland Kitchen MINIVELLE 0.05 MG/24HR patch     . omeprazole (PRILOSEC) 20 MG capsule TAKE 1 CAPSULE DAILY (Patient taking differently: Take 20 mg by mouth daily as needed. ) 90 capsule 1  . Probiotic Product ([employer] PROBIOTIC) CAPS Take by mouth. Pt unaware of strength. Once daily.    . progesterone (PROMETRIUM) 100 MG capsule Take 100 mg by mouth daily.    . valACYclovir (VALTREX) 1000 MG tablet daily as needed. Take as  directed     No facility-administered medications prior to visit.      EXAM:  BP 112/62 (BP Location: Left Arm, Patient Position: Sitting, Cuff Size: Large)   Pulse 70   Temp 98.5 F (36.9 C) (Oral)   Ht 5' 5.25" (1.657 m)   Wt 213 lb 3.2 oz (96.7 kg)   LMP 06/11/2012   BMI 35.21 kg/m   Body mass index is 35.21 kg/m.  GENERAL: vitals reviewed and listed above, alert, oriented, appears well hydrated and in no acute distress has a mildly stiff neck. HEENT: atraumatic, conjunctiva  clear, no obvious abnormalities on inspection of external nose and ears OP : no lesion edema or exudate  NECK: no obvious masses on inspection palpation she has a tight right trapezius and paracervical area no midline tenderness pain and stiffness with right sided movement no meningismus.  No mass-effect no bruits. LMS: moves all extremities without noticeable focal  abnormality PSYCH: pleasant and cooperative, no obvious depression or anxiety  BP Readings from Last 3 Encounters:  08/15/18 112/62  06/02/18 100/64  01/30/18 90/70    ASSESSMENT AND PLAN:  Discussed the following assessment and plan:  Neck muscle spasm  Neck pain This appears to be mute musculoskeletal without obvious underlying disease discussed neck hygiene she is aware local parameters gave her prescription for muscle relaxant that has mixed reviews but can take it at night that might help her sleep that is been disrupted recently. Discussed exercises stretching and follow-up with Dr. Maudie Mercury for further help if needed. Discussed prevention stretching etc. -Patient advised to return or notify health care team  if  new concerns arise.  Patient Instructions  I agree this is muscle neck pain and not alarming  .    But advise gentle stretching exercises  Attend to neck hygiene .   Can try muscle relaxant at night  But   dont have to do this   As no change in long term  Outcomes.     Acute Torticollis, Adult Torticollis is a  condition in which the muscles of the neck tighten (contract) abnormally, causing the neck to twist and the head to move  into an unnatural position. Torticollis that develops suddenly is called acute torticollis. People with acute torticollis may have trouble turning their head. The condition can be painful and may range from mild to severe. What are the causes? This condition may be caused by:  Sleeping in an awkward position (common).  Extending or twisting the neck muscles beyond their normal position.  An injury to the neck muscles.  An infection.  A tumor.  Certain medicines.  Long-lasting spasms of the neck muscles. In some cases, the cause may not be known. What increases the risk? You are more likely to develop this condition if:  You have a condition associated with loose ligaments, such as Down syndrome.  You have a brain condition that affects vision, such as strabismus. What are the signs or symptoms? The main symptom of this condition is tilting of the head to one side. Other symptoms include:  Pain in the neck.  Trouble turning the head from side to side or up and down. How is this diagnosed? This condition may be diagnosed based on:  A physical exam.  Your medical history.  Imaging tests, such as: ? An X-ray. ? An ultrasound. ? A CT scan. ? An MRI. How is this treated? Treatment for this condition depends on what is causing the condition. Mild cases may go away without treatment. Treatment for more serious cases may include:  Medicines or shots to relax the muscles.  Other medicines, such as antibiotics to treat the underlying cause.  Wearing a soft neck collar.  Physical therapy and stretching to improve neck strength and flexibility.  Neck massage. In severe cases, surgery may be needed to repair dislocated or broken bones or to treat nerves in the neck. Follow these instructions at home:   Take over-the-counter and prescription medicines  only as told by your health care provider.  Do stretching exercises and massage your neck as told by your health care provider.  If directed, apply heat to the affected area as often as told by your health care provider. Use the heat source that your health care provider recommends, such as a moist heat pack or a heating pad. ? Place a towel between your skin and the heat source. ? Leave the heat on for 20-30 minutes. ? Remove the heat if your skin turns bright red. This is especially important if you are unable to feel pain, heat, or cold. You may have a greater risk of getting burned.  If you wake up with torticollis after sleeping, check your bed or sleeping area. Look for lumpy pillows or unusual objects. Make sure your bed and sleeping area are comfortable.  Keep all follow-up visits as told by your health care provider. This is important. Contact a health care provider if:  You have a fever.  Your symptoms do not improve or they get worse. Get help right away if:  You have trouble breathing.  You develop noisy breathing (stridor).  You start to drool.  You have trouble swallowing or pain when swallowing.  You develop numbness or weakness in your hands or feet.  You have changes in your speech, understanding, or vision.  You are in severe pain.  You cannot move your head or neck. Summary  Torticollis is a condition in which the muscles of the neck tighten (contract) abnormally, causing the neck to twist and the head to move into an unnatural position. Torticollis that develops suddenly is called acute torticollis.  Treatment for this condition depends  on what is causing the condition. Mild cases may go away without treatment.  Do stretching exercises and massage your neck as told by your health care provider. You may also be instructed to apply heat to the area.  Contact your health care provider if your symptoms do not improve or they get worse. This information is  not intended to replace advice given to you by your health care provider. Make sure you discuss any questions you have with your health care provider. Document Released: 06/08/2000 Document Revised: 08/09/2016 Document Reviewed: 08/09/2016 Elsevier Interactive Patient Education  2019 Shirley K. Emauri Krygier M.D.

## 2018-10-02 ENCOUNTER — Encounter: Payer: BLUE CROSS/BLUE SHIELD | Admitting: Family Medicine

## 2018-10-23 ENCOUNTER — Encounter: Payer: BLUE CROSS/BLUE SHIELD | Admitting: Family Medicine

## 2019-01-30 ENCOUNTER — Encounter: Payer: BLUE CROSS/BLUE SHIELD | Admitting: Family Medicine

## 2019-03-13 ENCOUNTER — Telehealth: Payer: Self-pay | Admitting: *Deleted

## 2019-03-13 ENCOUNTER — Other Ambulatory Visit: Payer: Self-pay

## 2019-03-13 ENCOUNTER — Encounter: Payer: Self-pay | Admitting: Family Medicine

## 2019-03-13 ENCOUNTER — Ambulatory Visit (INDEPENDENT_AMBULATORY_CARE_PROVIDER_SITE_OTHER): Payer: BLUE CROSS/BLUE SHIELD | Admitting: Family Medicine

## 2019-03-13 VITALS — Wt 199.8 lb

## 2019-03-13 DIAGNOSIS — E785 Hyperlipidemia, unspecified: Secondary | ICD-10-CM | POA: Diagnosis not present

## 2019-03-13 DIAGNOSIS — F3342 Major depressive disorder, recurrent, in full remission: Secondary | ICD-10-CM

## 2019-03-13 DIAGNOSIS — T7840XD Allergy, unspecified, subsequent encounter: Secondary | ICD-10-CM | POA: Diagnosis not present

## 2019-03-13 DIAGNOSIS — K219 Gastro-esophageal reflux disease without esophagitis: Secondary | ICD-10-CM | POA: Diagnosis not present

## 2019-03-13 DIAGNOSIS — R739 Hyperglycemia, unspecified: Secondary | ICD-10-CM

## 2019-03-13 NOTE — Telephone Encounter (Signed)
Unable to leave a message at the pts home number due to no voicemail being set up-will attempt repeat call.

## 2019-03-13 NOTE — Progress Notes (Signed)
Virtual Visit via Video Note  I connected with Claudia Warner   on 03/13/19 at  2:30 PM EDT by a video enabled telemedicine application and verified that I am speaking with the correct person using two identifiers.  Location patient: home Location provider:work office Persons participating in the virtual visit: patient, provider  I discussed the limitations of evaluation and management by telemedicine and the availability of in person appointments. The patient expressed understanding and agreed to proceed.   Claudia Warner DOB: 05/07/1961 Encounter date: 03/13/2019  This is a 58 y.o. female who presents to establish care. Last visit with [facility] was 05/2018; last labwork at this time as well.   Chief Complaint  Patient presents with  . Establish Care    History of present illness: No specific concerns today. 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 Brother and sister are nearby, but most of care falls on her. Mother getting more agitated.    GERD: prilosec 20mg  daily; doesn't use this regularly any more.   Allergies: seasonal. Not bad this year. Not sure about asthmatic bronchitis dx in chart; states never dx with asthma that she recalled. Thinks cold that developed into bronchitis.   HL: diet/exercise controlled.   Hx of depression: follows with Dr. Toy Care - wellbutrin, lexapro. Mood has been OK. Father was in hospice care from July to August so there were some ups and downs.   Follows with gyn for HRT/possible endometriosis, Dr. Alfred Levins. Had mammogram and bone density done last year.   Follows with dr. Wilhemina Bonito for derm.   colonoscopy last in 02/2015; repeat in 5 years   Past Medical History:  Diagnosis Date  . ALLERGIC RHINITIS 09/23/2007   Qualifier: Diagnosis of  By: Lenna Gilford MD, Deborra Medina   . Asthmatic bronchitis    UARS (upper airway resistance syndrome)  . DEPRESSION 09/23/2007   Qualifier: Diagnosis of  By: Lenna Gilford MD, Deborra Medina   . Genital herpes 06/03/2014  . GERD  (gastroesophageal reflux disease)   . Hot flashes 06/03/2014  . HYPERCHOLESTEROLEMIA, BORDERLINE 09/23/2007   Qualifier: Diagnosis of  By: Lenna Gilford MD, Deborra Medina   . Palpitations   . Personal history of colonic polyps-adenoma 10/23/2011   Diminutive cecal tubular adenoma 09/2011 Carlean Purl)   . Uterine fibroid    Past Surgical History:  Procedure Laterality Date  . COLONOSCOPY    . WISDOM TOOTH EXTRACTION  1990   Allergies  Allergen Reactions  . Sudafed [Pseudoephedrine Hcl]     Heart races   Current Meds  Medication Sig  . buPROPion (WELLBUTRIN XL) 150 MG 24 hr tablet Take 150 mg by mouth daily.  Marland Kitchen escitalopram (LEXAPRO) 10 MG tablet Take 5 mg by mouth daily.  Marland Kitchen MINIVELLE 0.05 MG/24HR patch   . omeprazole (PRILOSEC) 20 MG capsule TAKE 1 CAPSULE DAILY (Patient taking differently: Take 20 mg by mouth daily as needed. )  . Probiotic Product ([employer] PROBIOTIC) CAPS Take by mouth. Pt unaware of strength. Once daily.  . progesterone (PROMETRIUM) 100 MG capsule Take 100 mg by mouth daily.  . valACYclovir (VALTREX) 1000 MG tablet daily as needed. Take as directed  . [DISCONTINUED] metaxalone (SKELAXIN) 800 MG tablet Take 1 tablet (800 mg total) by mouth 3 (three) times daily. For muscle spasm   Social History   Tobacco Use  . Smoking status: Former Smoker    Packs/day: 0.25    Years: 2.00    Pack years: 0.50    Types: Cigarettes  Quit date: 06/25/1978    Years since quitting: 40.7  . Smokeless tobacco: Never Used  Substance Use Topics  . Alcohol use: No   Family History  Problem Relation Age of Onset  . Heart disease Father        chf  . Pulmonary Hypertension Father   . Kidney failure Father   . Hodgkin's lymphoma Sister   . Colon cancer Maternal Aunt 71  . Heart disease Mother        a fib  . Vascular Disease Mother   . Dementia Mother 47  . Atrial fibrillation Mother   . Stroke Mother   . Colon cancer Maternal Aunt   . Uterine cancer Maternal Aunt        ovarian  .  Melanoma Maternal Grandmother   . Stomach cancer Neg Hx      Review of Systems  Constitutional: Negative for chills, fatigue and fever.  Respiratory: Negative for cough, chest tightness, shortness of breath and wheezing.   Cardiovascular: Negative for chest pain, palpitations and leg swelling.    Objective:  Wt 199 lb 12.8 oz (90.6 kg) Comment: per patient-jaf  LMP 06/11/2012   BMI 32.99 kg/m   Weight: 199 lb 12.8 oz (90.6 kg)(per patient-jaf)   BP Readings from Last 3 Encounters:  08/15/18 112/62  06/02/18 100/64  01/30/18 90/70   Wt Readings from Last 3 Encounters:  03/13/19 199 lb 12.8 oz (90.6 kg)  08/15/18 213 lb 3.2 oz (96.7 kg)  06/02/18 205 lb 8 oz (93.2 kg)    EXAM:  GENERAL: alert, oriented, appears well and in no acute distress  HEENT: atraumatic, conjunctiva clear, no obvious abnormalities on inspection of external nose and ears  NECK: normal movements of the head and neck  LUNGS: on inspection no signs of respiratory distress, breathing rate appears normal, no obvious gross SOB, gasping or wheezing  CV: no obvious cyanosis  MS: moves all visible extremities without noticeable abnormality  PSYCH/NEURO: pleasant and cooperative, no obvious depression or anxiety, speech and thought processing grossly intact    Assessment/Plan  1. Hyperlipidemia, unspecified hyperlipidemia type Diet controlled - Comprehensive metabolic panel; Future - Lipid panel; Future - TSH; Future  2. Gastroesophageal reflux disease, esophagitis presence not specified Continue with omeprazole.  3. Allergic state, subsequent encounter Stable.  Over-the-counter medications as needed for symptoms. - CBC with Differential/Platelet; Future  4. Recurrent major depressive disorder, in full remission ([place]) Stable on Wellbutrin and Lexapro.  Follows with psychiatry.  5. Hyperglycemia - Hemoglobin A1c; Future    Return for bloodwork and then physical.   I discussed the  assessment and treatment plan with the patient. The patient was provided an opportunity to ask questions and all were answered. The patient agreed with the plan and demonstrated an understanding of the instructions.   The patient was advised to call back or seek an in-person evaluation if the symptoms worsen or if the condition fails to improve as anticipated.  I provided 28 minutes of non-face-to-face time during this encounter.   Micheline Rough, MD

## 2019-03-13 NOTE — Telephone Encounter (Signed)
-----   Message from Caren Macadam, MD sent at 03/13/2019  3:15 PM EDT ----- Wants flu shot; then please schedule physical for December following bloodwork.

## 2019-03-19 ENCOUNTER — Telehealth: Payer: Self-pay | Admitting: *Deleted

## 2019-03-19 NOTE — Telephone Encounter (Signed)
Called the pt and scheduled a lab and CPE appt.  Offered a flu clinic appt mid October and the patient stated she will go to a pharmacy for the flu vaccine.

## 2019-04-02 ENCOUNTER — Other Ambulatory Visit: Payer: Self-pay

## 2019-04-02 DIAGNOSIS — Z20828 Contact with and (suspected) exposure to other viral communicable diseases: Secondary | ICD-10-CM | POA: Diagnosis not present

## 2019-04-02 DIAGNOSIS — Z20822 Contact with and (suspected) exposure to covid-19: Secondary | ICD-10-CM

## 2019-04-03 LAB — NOVEL CORONAVIRUS, NAA: SARS-CoV-2, NAA: NOT DETECTED

## 2019-04-16 DIAGNOSIS — Z23 Encounter for immunization: Secondary | ICD-10-CM | POA: Diagnosis not present

## 2019-04-16 DIAGNOSIS — N959 Unspecified menopausal and perimenopausal disorder: Secondary | ICD-10-CM | POA: Diagnosis not present

## 2019-04-16 DIAGNOSIS — Z6834 Body mass index (BMI) 34.0-34.9, adult: Secondary | ICD-10-CM | POA: Diagnosis not present

## 2019-04-16 DIAGNOSIS — Z1231 Encounter for screening mammogram for malignant neoplasm of breast: Secondary | ICD-10-CM | POA: Diagnosis not present

## 2019-04-16 DIAGNOSIS — Z01419 Encounter for gynecological examination (general) (routine) without abnormal findings: Secondary | ICD-10-CM | POA: Diagnosis not present

## 2019-04-16 DIAGNOSIS — N950 Postmenopausal bleeding: Secondary | ICD-10-CM | POA: Diagnosis not present

## 2019-04-27 DIAGNOSIS — N950 Postmenopausal bleeding: Secondary | ICD-10-CM | POA: Diagnosis not present

## 2019-04-27 DIAGNOSIS — D259 Leiomyoma of uterus, unspecified: Secondary | ICD-10-CM | POA: Diagnosis not present

## 2019-05-05 NOTE — Telephone Encounter (Signed)
Error

## 2019-05-06 DIAGNOSIS — D259 Leiomyoma of uterus, unspecified: Secondary | ICD-10-CM | POA: Diagnosis not present

## 2019-05-06 DIAGNOSIS — N950 Postmenopausal bleeding: Secondary | ICD-10-CM | POA: Diagnosis not present

## 2019-05-06 DIAGNOSIS — N840 Polyp of corpus uteri: Secondary | ICD-10-CM | POA: Diagnosis not present

## 2019-05-06 DIAGNOSIS — Z79890 Hormone replacement therapy (postmenopausal): Secondary | ICD-10-CM | POA: Diagnosis not present

## 2019-05-12 ENCOUNTER — Other Ambulatory Visit: Payer: Self-pay

## 2019-05-12 DIAGNOSIS — Z20822 Contact with and (suspected) exposure to covid-19: Secondary | ICD-10-CM

## 2019-05-13 LAB — NOVEL CORONAVIRUS, NAA: SARS-CoV-2, NAA: DETECTED — AB

## 2019-05-14 ENCOUNTER — Encounter: Payer: Self-pay | Admitting: Family Medicine

## 2019-05-25 ENCOUNTER — Other Ambulatory Visit: Payer: Self-pay

## 2019-05-25 DIAGNOSIS — Z20822 Contact with and (suspected) exposure to covid-19: Secondary | ICD-10-CM

## 2019-05-26 LAB — NOVEL CORONAVIRUS, NAA: SARS-CoV-2, NAA: NOT DETECTED

## 2019-06-09 ENCOUNTER — Other Ambulatory Visit: Payer: BLUE CROSS/BLUE SHIELD

## 2019-06-11 DIAGNOSIS — N950 Postmenopausal bleeding: Secondary | ICD-10-CM | POA: Diagnosis not present

## 2019-06-11 DIAGNOSIS — D259 Leiomyoma of uterus, unspecified: Secondary | ICD-10-CM | POA: Diagnosis not present

## 2019-06-12 ENCOUNTER — Other Ambulatory Visit: Payer: BLUE CROSS/BLUE SHIELD

## 2019-06-12 ENCOUNTER — Other Ambulatory Visit: Payer: Self-pay

## 2019-06-15 ENCOUNTER — Encounter: Payer: BLUE CROSS/BLUE SHIELD | Admitting: Family Medicine

## 2019-06-22 IMAGING — DX DG LUMBAR SPINE COMPLETE 4+V
5 series · 5 of 5 positions shown · non-contrast
Comparison: No recent.

CLINICAL DATA: Low back pain.  No known injury.

EXAM:
LUMBAR SPINE - COMPLETE 4+ VIEW

[l-spine ap]
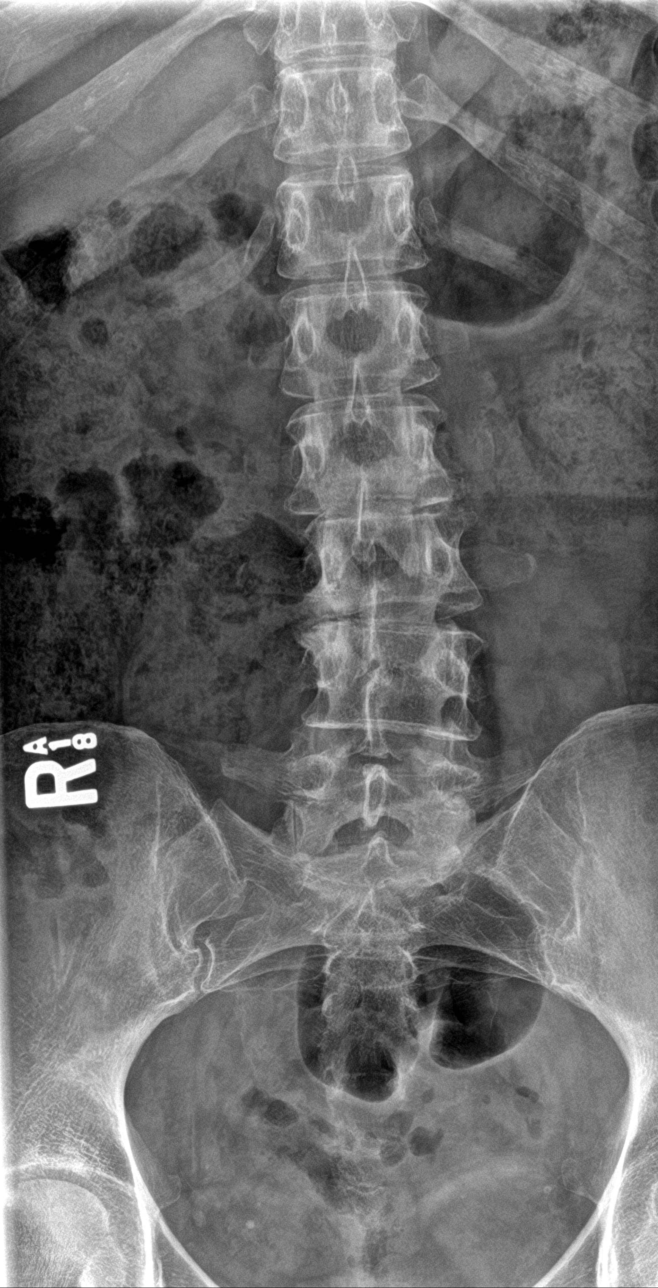

[l-spine obl (1 of 2)]
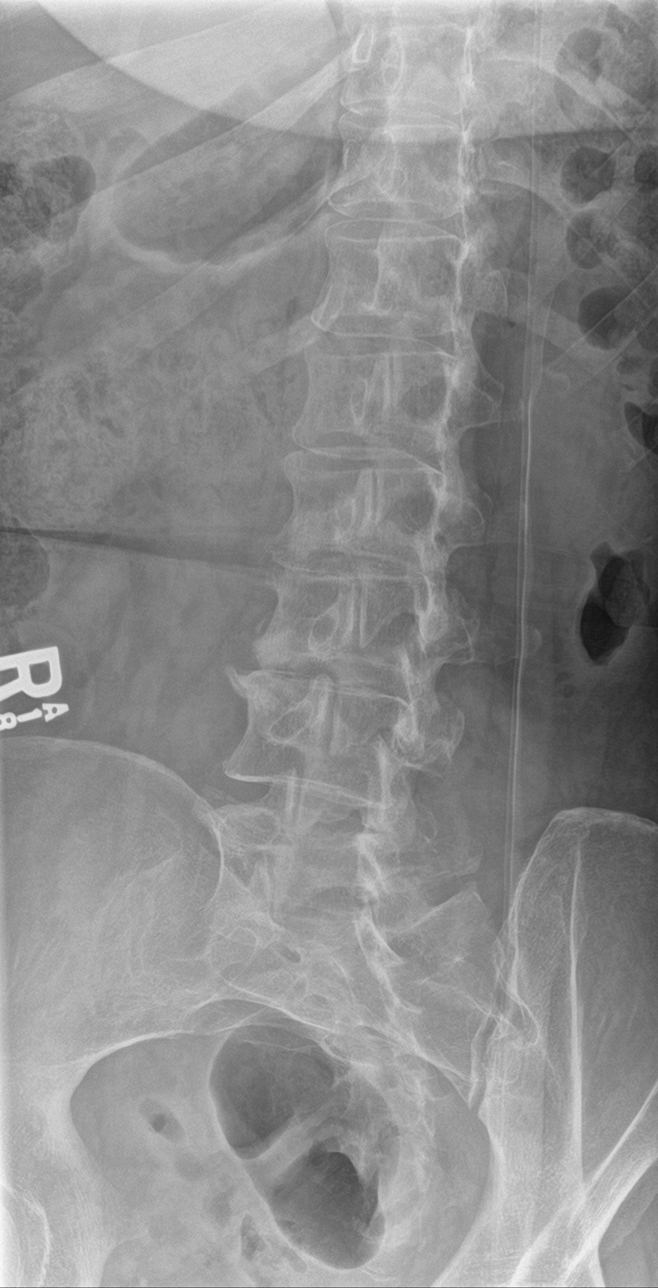

[l-spine obl (2 of 2)]
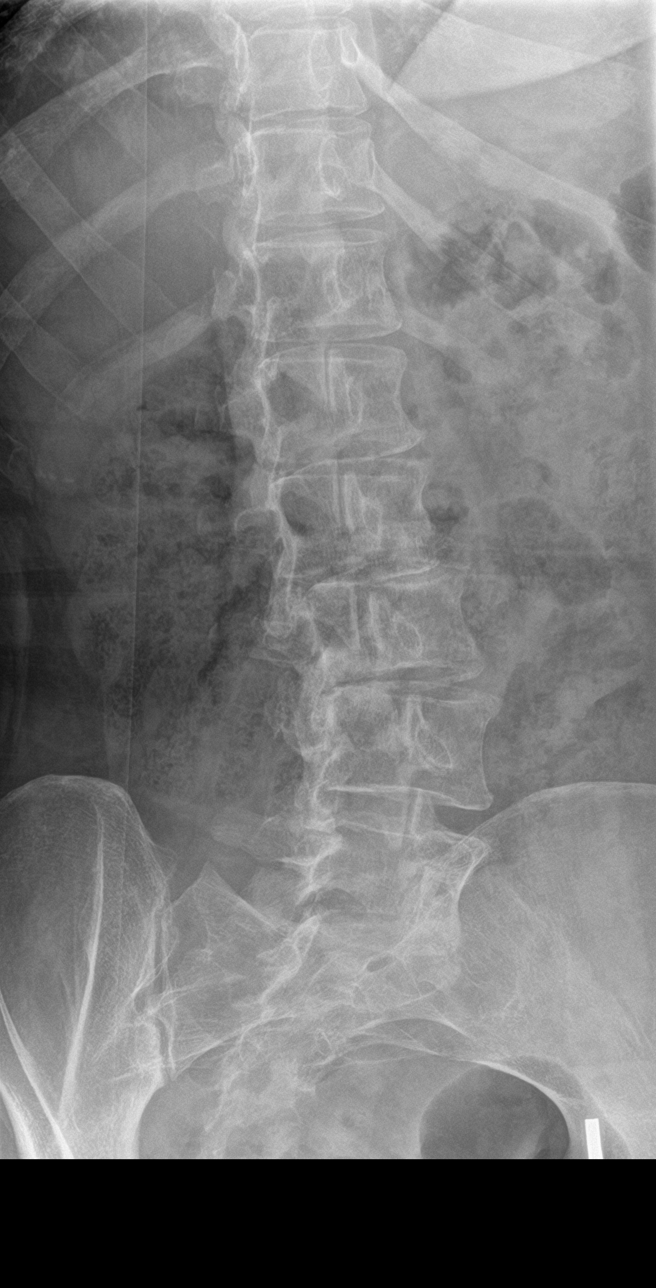

[l-spine lat]
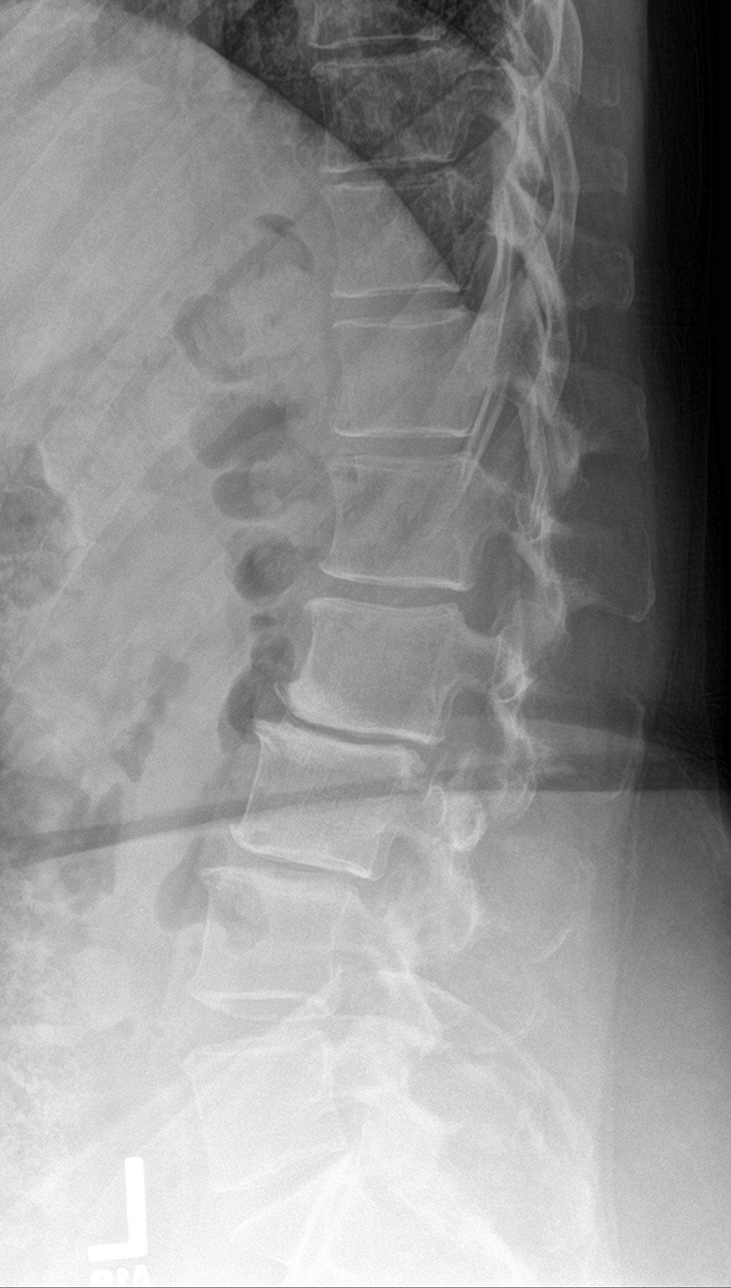

[l-spine spot]
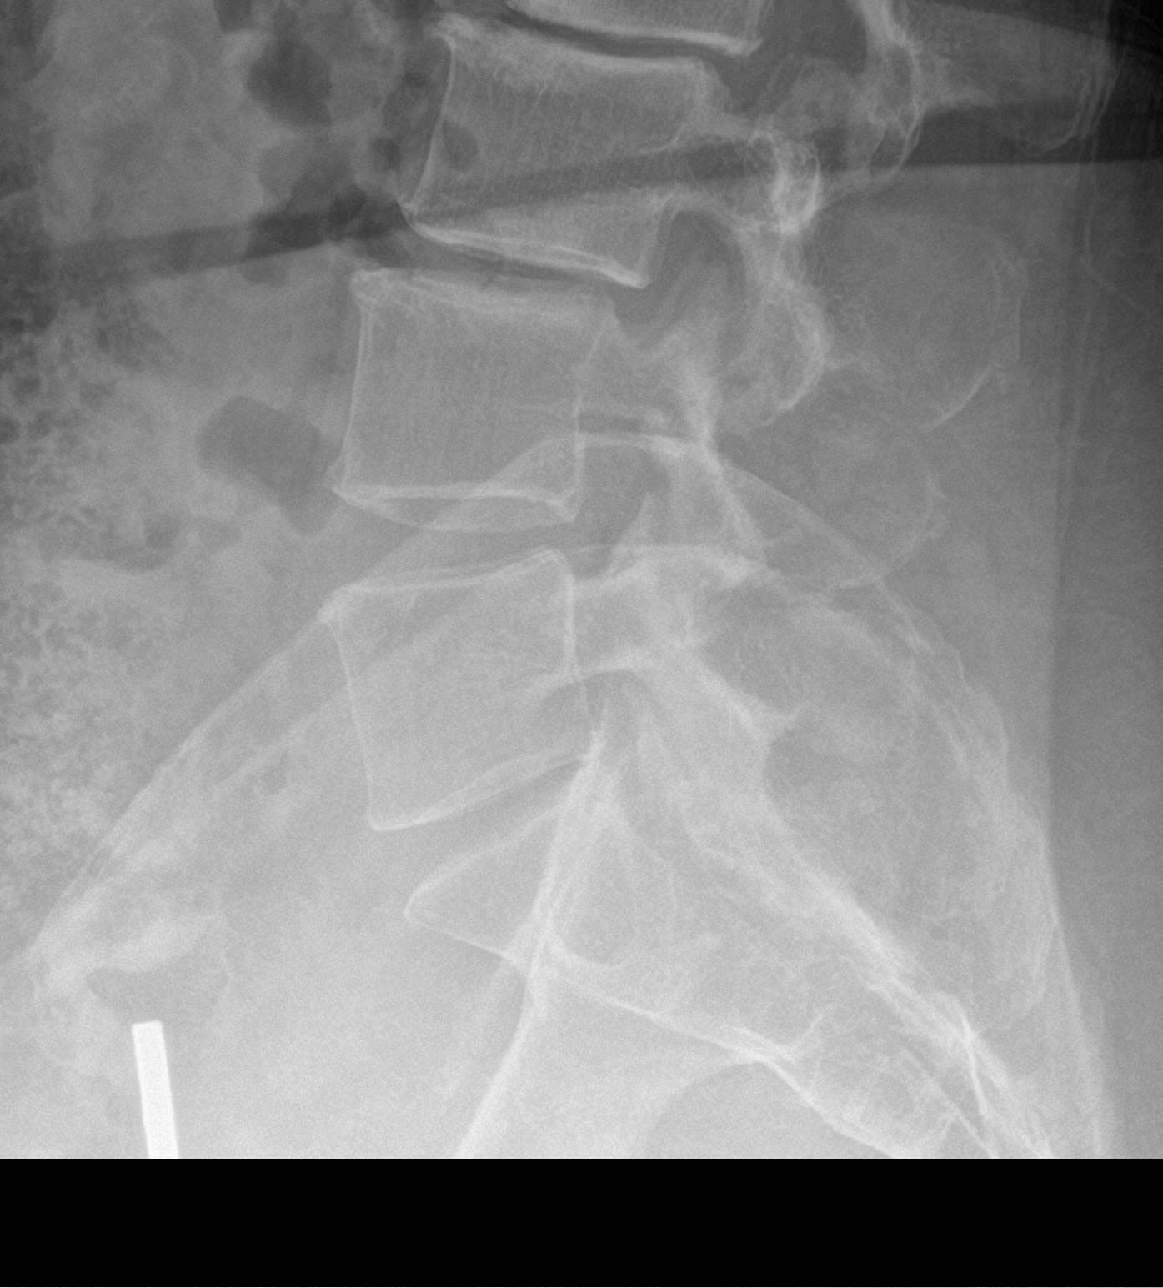

[5 of 5 positions shown; findings below may reference images not displayed]

FINDINGS: Lumbar spine numbered with the lowest lumbar shaped segmented
appearing vertebra on lateral view as L5. Lumbar spine scoliosis
concave right. Stool noted throughout the colon. Calcifications in
pelvis consistent phleboliths. 4 mm retrolisthesis L2 on L3 and L3
on L4. No acute bony abnormality identified.
IMPRESSION: Lumbar spine scoliosis concave right. 4 mm retrolisthesis L2 on L3
and L3 on L4. No acute bony abnormality identified.

## 2019-07-14 DIAGNOSIS — F3342 Major depressive disorder, recurrent, in full remission: Secondary | ICD-10-CM | POA: Diagnosis not present

## 2019-09-16 DIAGNOSIS — H2513 Age-related nuclear cataract, bilateral: Secondary | ICD-10-CM | POA: Diagnosis not present

## 2019-09-16 DIAGNOSIS — H52203 Unspecified astigmatism, bilateral: Secondary | ICD-10-CM | POA: Diagnosis not present

## 2019-09-16 DIAGNOSIS — H35411 Lattice degeneration of retina, right eye: Secondary | ICD-10-CM | POA: Diagnosis not present

## 2019-09-16 DIAGNOSIS — H5000 Unspecified esotropia: Secondary | ICD-10-CM | POA: Diagnosis not present

## 2019-10-02 ENCOUNTER — Other Ambulatory Visit: Payer: Self-pay

## 2019-10-05 ENCOUNTER — Other Ambulatory Visit (INDEPENDENT_AMBULATORY_CARE_PROVIDER_SITE_OTHER): Payer: BLUE CROSS/BLUE SHIELD

## 2019-10-05 ENCOUNTER — Other Ambulatory Visit: Payer: Self-pay

## 2019-10-05 DIAGNOSIS — E785 Hyperlipidemia, unspecified: Secondary | ICD-10-CM | POA: Diagnosis not present

## 2019-10-05 DIAGNOSIS — R739 Hyperglycemia, unspecified: Secondary | ICD-10-CM

## 2019-10-05 DIAGNOSIS — T7840XD Allergy, unspecified, subsequent encounter: Secondary | ICD-10-CM

## 2019-10-05 LAB — LIPID PANEL
Cholesterol: 231 mg/dL — ABNORMAL HIGH (ref 0–200)
HDL: 48.4 mg/dL (ref >39.00)
LDL Cholesterol: 157 mg/dL — ABNORMAL HIGH (ref 0–99)
NonHDL: 182.65
Total CHOL/HDL Ratio: 5
Triglycerides: 127 mg/dL (ref 0.0–149.0)
VLDL: 25.4 mg/dL (ref 0.0–40.0)

## 2019-10-05 LAB — CBC WITH DIFFERENTIAL/PLATELET
Basophils Absolute: 0.1 10*3/uL (ref 0.0–0.1)
Basophils Relative: 0.8 % (ref 0.0–3.0)
Eosinophils Absolute: 0.1 10*3/uL (ref 0.0–0.7)
Eosinophils Relative: 1.1 % (ref 0.0–5.0)
HCT: 42.8 % (ref 36.0–46.0)
Hemoglobin: 14.5 g/dL (ref 12.0–15.0)
Lymphocytes Relative: 37.1 % (ref 12.0–46.0)
Lymphs Abs: 2.4 10*3/uL (ref 0.7–4.0)
MCHC: 33.9 g/dL (ref 30.0–36.0)
MCV: 92.4 fl (ref 78.0–100.0)
Monocytes Absolute: 0.6 10*3/uL (ref 0.1–1.0)
Monocytes Relative: 8.8 % (ref 3.0–12.0)
Neutro Abs: 3.4 10*3/uL (ref 1.4–7.7)
Neutrophils Relative %: 52.2 % (ref 43.0–77.0)
Platelets: 256 10*3/uL (ref 150.0–400.0)
RBC: 4.64 Mil/uL (ref 3.87–5.11)
RDW: 13 % (ref 11.5–15.5)
WBC: 6.4 10*3/uL (ref 4.0–10.5)

## 2019-10-05 LAB — COMPREHENSIVE METABOLIC PANEL
ALT: 13 U/L (ref 0–35)
AST: 15 U/L (ref 0–37)
Albumin: 4.3 g/dL (ref 3.5–5.2)
Alkaline Phosphatase: 129 U/L — ABNORMAL HIGH (ref 39–117)
BUN: 14 mg/dL (ref 6–23)
CO2: 30 mEq/L (ref 19–32)
Calcium: 9.3 mg/dL (ref 8.4–10.5)
Chloride: 103 mEq/L (ref 96–112)
Creatinine, Ser: 0.94 mg/dL (ref 0.40–1.20)
GFR: 61.1 mL/min (ref >60.00)
Glucose, Bld: 82 mg/dL (ref 70–99)
Potassium: 4.6 mEq/L (ref 3.5–5.1)
Sodium: 141 mEq/L (ref 135–145)
Total Bilirubin: 0.7 mg/dL (ref 0.2–1.2)
Total Protein: 7.4 g/dL (ref 6.0–8.3)

## 2019-10-05 LAB — TSH: TSH: 4.35 u[IU]/mL (ref 0.35–4.50)

## 2019-10-05 LAB — HEMOGLOBIN A1C: Hgb A1c MFr Bld: 5.5 % (ref 4.6–6.5)

## 2019-10-12 ENCOUNTER — Other Ambulatory Visit: Payer: Self-pay

## 2019-10-12 ENCOUNTER — Encounter: Payer: Self-pay | Admitting: Family Medicine

## 2019-10-12 ENCOUNTER — Ambulatory Visit (INDEPENDENT_AMBULATORY_CARE_PROVIDER_SITE_OTHER): Payer: BLUE CROSS/BLUE SHIELD | Admitting: Family Medicine

## 2019-10-12 VITALS — BP 110/78 | HR 60 | Temp 97.8°F | Ht 65.0 in | Wt 215.4 lb

## 2019-10-12 DIAGNOSIS — E785 Hyperlipidemia, unspecified: Secondary | ICD-10-CM

## 2019-10-12 DIAGNOSIS — Z0000 Encounter for general adult medical examination without abnormal findings: Secondary | ICD-10-CM | POA: Diagnosis not present

## 2019-10-12 DIAGNOSIS — L309 Dermatitis, unspecified: Secondary | ICD-10-CM | POA: Diagnosis not present

## 2019-10-12 MED ORDER — TRIAMCINOLONE ACETONIDE 0.1 % EX CREA: 1.0000 "application " | TOPICAL_CREAM | Freq: Two times a day (BID) | CUTANEOUS | 0 refills | Status: AC

## 2019-10-12 NOTE — Patient Instructions (Addendum)
Consider Effexor for hot flashes if worsening.  Trial of fiber to help with bowels. Let me know if not better.

## 2019-10-12 NOTE — Progress Notes (Signed)
Claudia Warner DOB: 1961-06-24 Encounter date: 10/12/2019  This is a 59 y.o. female who presents for complete physical   History of present illness/Additional concerns:  mother recently passed and within year of her father. Dad's sister is 22 and mom's sister is 75 and she keeps tabs on them.  All the passing of her parents was very sad and stressful, she is happy that they are now together. Allergies: hasn't been too bad, but she can tell there is more pollen.   Hx of depression: follows with Dr. Toy Care - actually feels like this has been stable and that she is grieving normally currently.  Follows with gyn Dr. Alfred Levins. Had mammogram and bone density done last fall. Had US done with her due to spotting. They found polyps in uterus and had these removed in Nov. Stopped hormone replacement. Still getting hot flashes; thinks gabapentin helps some.   Follows with dr. Wilhemina Bonito for derm.   colonoscopy last in 02/2015; repeat in 5 years  Past Medical History:  Diagnosis Date  . ALLERGIC RHINITIS 09/23/2007   Qualifier: Diagnosis of  By: Lenna Gilford MD, Deborra Medina   . Asthmatic bronchitis    UARS (upper airway resistance syndrome)  . DEPRESSION 09/23/2007   Qualifier: Diagnosis of  By: Lenna Gilford MD, Deborra Medina   . Genital herpes 06/03/2014  . GERD (gastroesophageal reflux disease)   . Hot flashes 06/03/2014  . HYPERCHOLESTEROLEMIA, BORDERLINE 09/23/2007   Qualifier: Diagnosis of  By: Lenna Gilford MD, Deborra Medina   . Palpitations   . Personal history of colonic polyps-adenoma 10/23/2011   Diminutive cecal tubular adenoma 09/2011 Carlean Purl)   . Uterine fibroid    Past Surgical History:  Procedure Laterality Date  . COLONOSCOPY    . WISDOM TOOTH EXTRACTION  1990   Allergies  Allergen Reactions  . Sudafed [Pseudoephedrine Hcl]     Heart races   Current Meds  Medication Sig  . buPROPion (WELLBUTRIN XL) 150 MG 24 hr tablet Take 150 mg by mouth daily.  Marland Kitchen escitalopram (LEXAPRO) 10 MG tablet Take 5 mg by mouth daily.   Marland Kitchen gabapentin (NEURONTIN) 300 MG capsule Take 300 mg by mouth daily.   Marland Kitchen omeprazole (PRILOSEC) 20 MG capsule TAKE 1 CAPSULE DAILY (Patient taking differently: Take 20 mg by mouth daily as needed. )  . Probiotic Product ([employer] PROBIOTIC) CAPS Take by mouth. Pt unaware of strength. Once daily.  . valACYclovir (VALTREX) 1000 MG tablet daily as needed. Take as directed   Social History   Tobacco Use  . Smoking status: Former Smoker    Packs/day: 0.25    Years: 2.00    Pack years: 0.50    Types: Cigarettes    Quit date: 06/25/1978    Years since quitting: 41.3  . Smokeless tobacco: Never Used  Substance Use Topics  . Alcohol use: No   Family History  Problem Relation Age of Onset  . Heart disease Father        chf  . Pulmonary Hypertension Father   . Kidney failure Father   . Hodgkin's lymphoma Sister   . Colonic polyp Sister   . Colon cancer Maternal Aunt 49  . Heart disease Mother        a fib  . Vascular Disease Mother   . Dementia Mother 69  . Atrial fibrillation Mother   . Stroke Mother   . Colon cancer Maternal Aunt   . Uterine cancer Maternal Aunt        ovarian  .  Melanoma Maternal Grandmother   . Stomach cancer Neg Hx      Review of Systems  Constitutional: Negative for activity change, appetite change, chills, fatigue, fever and unexpected weight change.  HENT: Negative for congestion, ear pain, hearing loss, sinus pressure, sinus pain, sore throat and trouble swallowing.   Eyes: Negative for pain and visual disturbance.  Respiratory: Negative for cough, chest tightness, shortness of breath and wheezing.   Cardiovascular: Negative for chest pain, palpitations and leg swelling.  Gastrointestinal: Negative for abdominal pain, blood in stool, constipation, diarrhea, nausea and vomiting.  Genitourinary: Negative for difficulty urinating and menstrual problem.  Musculoskeletal: Negative for arthralgias and back pain.  Skin: Negative for rash.  Neurological: Negative  for dizziness, weakness, numbness and headaches.  Hematological: Negative for adenopathy. Does not bruise/bleed easily.  Psychiatric/Behavioral: Negative for sleep disturbance and suicidal ideas. The patient is not nervous/anxious.        Has had a hard time with recent passing of her parents.  Also worries about her husband who she feels has some memory concerns.    CBC:  Lab Results  Component Value Date   WBC 6.4 10/05/2019   HGB 14.5 10/05/2019   HCT 42.8 10/05/2019   MCHC 33.9 10/05/2019   RDW 13.0 10/05/2019   PLT 256.0 10/05/2019   CMP: Lab Results  Component Value Date   NA 141 10/05/2019   K 4.6 10/05/2019   CL 103 10/05/2019   CO2 30 10/05/2019   GLUCOSE 82 10/05/2019   BUN 14 10/05/2019   CREATININE 0.94 10/05/2019   CREATININE 0.97 05/24/2015   GFRAA 77 05/24/2015   CALCIUM 9.3 10/05/2019   PROT 7.4 10/05/2019   BILITOT 0.7 10/05/2019   ALKPHOS 129 (H) 10/05/2019   ALT 13 10/05/2019   AST 15 10/05/2019   LIPID: Lab Results  Component Value Date   CHOL 231 (H) 10/05/2019   TRIG 127.0 10/05/2019   HDL 48.40 10/05/2019   LDLCALC 157 (H) 10/05/2019    Objective:  BP 110/78 (BP Location: Left Arm, Patient Position: Sitting, Cuff Size: Large)   Pulse 60   Temp 97.8 F (36.6 C) (Temporal)   Ht 5\' 5"  (1.651 m)   Wt 215 lb 6.4 oz (97.7 kg)   LMP 06/11/2012   BMI 35.84 kg/m   Weight: 215 lb 6.4 oz (97.7 kg)   BP Readings from Last 3 Encounters:  10/12/19 110/78  08/15/18 112/62  06/02/18 100/64   Wt Readings from Last 3 Encounters:  10/12/19 215 lb 6.4 oz (97.7 kg)  03/13/19 199 lb 12.8 oz (90.6 kg)  08/15/18 213 lb 3.2 oz (96.7 kg)    Physical Exam Constitutional:      General: She is not in acute distress.    Appearance: She is well-developed.  HENT:     Head: Normocephalic and atraumatic.     Right Ear: External ear normal.     Left Ear: External ear normal.     Mouth/Throat:     Pharynx: No oropharyngeal exudate.  Eyes:      Conjunctiva/sclera: Conjunctivae normal.     Pupils: Pupils are equal, round, and reactive to light.  Neck:     Thyroid: No thyromegaly.  Cardiovascular:     Rate and Rhythm: Normal rate and regular rhythm.     Heart sounds: Normal heart sounds. No murmur. No friction rub. No gallop.   Pulmonary:     Effort: Pulmonary effort is normal.     Breath sounds: Normal breath  sounds.  Abdominal:     General: Bowel sounds are normal. There is no distension.     Palpations: Abdomen is soft. There is no mass.     Tenderness: There is no abdominal tenderness. There is no guarding.     Hernia: No hernia is present.  Musculoskeletal:        General: No tenderness or deformity. Normal range of motion.     Cervical back: Normal range of motion and neck supple.  Lymphadenopathy:     Cervical: No cervical adenopathy.  Skin:    General: Skin is warm and dry.     Findings: No rash.     Comments: Gets areas that pop up on her elbows that are quite dry.  Would like a cream for this.  Neurological:     Mental Status: She is alert and oriented to person, place, and time.     Deep Tendon Reflexes: Reflexes normal.     Reflex Scores:      Tricep reflexes are 2+ on the right side and 2+ on the left side.      Bicep reflexes are 2+ on the right side and 2+ on the left side.      Brachioradialis reflexes are 2+ on the right side and 2+ on the left side.      Patellar reflexes are 2+ on the right side and 2+ on the left side. Psychiatric:        Speech: Speech normal.        Behavior: Behavior normal.        Thought Content: Thought content normal.     Assessment/Plan: There are no preventive care reminders to display for this patient. Health Maintenance reviewed. 1. Preventative health care Up to date.  We discussed using this opportunity to take some time to work on her own health.  We discussed regular exercise and healthy eating.  2. Hyperlipidemia, unspecified hyperlipidemia type - Lipid panel;  Future - TSH; Future - T4, free; Future - T3, free; Future  3. Eczema, unspecified type - triamcinolone cream (KENALOG) 0.1 %; Apply 1 application topically 2 (two) times daily.  Dispense: 30 g; Refill: 0  Return in about 6 months (around 04/12/2020) for bloodwork and then CCV.  Micheline Rough, MD

## 2020-02-18 DIAGNOSIS — M21612 Bunion of left foot: Secondary | ICD-10-CM | POA: Diagnosis not present

## 2020-02-18 DIAGNOSIS — M79672 Pain in left foot: Secondary | ICD-10-CM | POA: Diagnosis not present

## 2020-02-18 DIAGNOSIS — M79671 Pain in right foot: Secondary | ICD-10-CM | POA: Diagnosis not present

## 2020-02-18 DIAGNOSIS — M21611 Bunion of right foot: Secondary | ICD-10-CM | POA: Diagnosis not present

## 2020-04-13 ENCOUNTER — Encounter: Payer: Self-pay | Admitting: Family Medicine

## 2020-04-13 ENCOUNTER — Other Ambulatory Visit: Payer: Self-pay

## 2020-04-13 ENCOUNTER — Ambulatory Visit: Payer: BLUE CROSS/BLUE SHIELD | Admitting: Family Medicine

## 2020-04-13 ENCOUNTER — Ambulatory Visit (INDEPENDENT_AMBULATORY_CARE_PROVIDER_SITE_OTHER): Payer: BLUE CROSS/BLUE SHIELD | Admitting: Family Medicine

## 2020-04-13 VITALS — BP 110/80 | HR 96 | Temp 98.0°F | Ht 65.0 in | Wt 214.7 lb

## 2020-04-13 DIAGNOSIS — M2550 Pain in unspecified joint: Secondary | ICD-10-CM | POA: Diagnosis not present

## 2020-04-13 DIAGNOSIS — M21611 Bunion of right foot: Secondary | ICD-10-CM

## 2020-04-13 DIAGNOSIS — Z86010 Personal history of colonic polyps: Secondary | ICD-10-CM

## 2020-04-13 DIAGNOSIS — E785 Hyperlipidemia, unspecified: Secondary | ICD-10-CM

## 2020-04-13 DIAGNOSIS — R232 Flushing: Secondary | ICD-10-CM

## 2020-04-13 DIAGNOSIS — Z23 Encounter for immunization: Secondary | ICD-10-CM

## 2020-04-13 DIAGNOSIS — M21612 Bunion of left foot: Secondary | ICD-10-CM

## 2020-04-13 DIAGNOSIS — K219 Gastro-esophageal reflux disease without esophagitis: Secondary | ICD-10-CM

## 2020-04-13 NOTE — Patient Instructions (Signed)
Whole 30 diet  Or mediterranean; or just low-inflammatory

## 2020-04-13 NOTE — Progress Notes (Signed)
Claudia Warner DOB: [birth year] Encounter date: 04/13/2020  This is a 59 y.o. female who presents with Chief Complaint  Patient presents with  . Follow-up    History of present illness:  Hot flashes can be any time - gets so hot in head that she starts sweating. Worse in summer, worse if she is doing anything. Has little portable fan that helps. Can strike any time. Gets hot at night, not getting up to change clothes/bedding. Interested in change to effexor per gyn suggestion.   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 Would like more strengthening in legs. Knees bother her, lower back bothers her. Didn't have them look at knees or back at all.  Unable to see Dr. Alfred Levins due to insurance?  Follows with dr. Wilhemina Bonito for derm.   colonoscopy last in 02/2015; repeat in 5 years  (follows with Dr. Bary Castilla has been stable on current wellbutrin 150mg  daily, lexapro 10mg  daily.   Allergies  Allergen Reactions  . Sudafed [Pseudoephedrine Hcl]     Heart races   Current Meds  Medication Sig  . buPROPion (WELLBUTRIN XL) 150 MG 24 hr tablet Take 150 mg by mouth daily.  Marland Kitchen escitalopram (LEXAPRO) 10 MG tablet Take 5 mg by mouth daily.  Marland Kitchen gabapentin (NEURONTIN) 300 MG capsule Take 300 mg by mouth daily.   Marland Kitchen omeprazole (PRILOSEC) 20 MG capsule TAKE 1 CAPSULE DAILY (Patient taking differently: Take 20 mg by mouth daily as needed. )  . triamcinolone cream (KENALOG) 0.1 % Apply 1 application topically 2 (two) times daily.  . valACYclovir (VALTREX) 1000 MG tablet daily as needed. Take as directed    Review of Systems  Constitutional: Negative for chills, fatigue and fever.  Respiratory: Negative for cough, chest tightness, shortness of breath and wheezing.   Cardiovascular: Negative for chest pain, palpitations and leg swelling.  Musculoskeletal: Positive for arthralgias, back pain and gait problem  (sometimes due to joint pain).    Objective:  BP 110/80 (BP Location: Left Arm, Patient Position: Sitting, Cuff Size: Large)   Pulse 96   Temp 98 F (36.7 C) (Oral)   Ht 5\' 5"  (1.651 m)   Wt 214 lb 11.2 oz (97.4 kg)   LMP 06/11/2012   SpO2 95%   BMI 35.73 kg/m   Weight: 214 lb 11.2 oz (97.4 kg)   BP Readings from Last 3 Encounters:  04/13/20 110/80  10/12/19 110/78  08/15/18 112/62   Wt Readings from Last 3 Encounters:  04/13/20 214 lb 11.2 oz (97.4 kg)  10/12/19 215 lb 6.4 oz (97.7 kg)  03/13/19 199 lb 12.8 oz (90.6 kg)    Physical Exam Constitutional:      General: She is not in acute distress.    Appearance: She is well-developed.  Cardiovascular:     Rate and Rhythm: Normal rate and regular rhythm.     Heart sounds: Normal heart sounds. No murmur heard.  No friction rub.  Pulmonary:     Effort: Pulmonary effort is normal. No respiratory distress.     Breath sounds: Normal breath sounds. No wheezing or rales.  Musculoskeletal:     Right lower leg: No edema.     Left lower leg: No edema.     Comments: Mild bogginess in PIP joints.  No significant tenderness with palpation.  Neurological:     Mental Status: She is alert and oriented to person,  place, and time.  Psychiatric:        Behavior: Behavior normal.     Assessment/Plan  1. Hot flashes Start with blood work.  We certainly could consider Effexor.  If we do so, I would like to touch base with Dr. Robina Ade before adjusting medications. - TSH; Future - T4, free; Future - T3, free; Future - T3, free - T4, free - TSH  2. Gastroesophageal reflux disease, unspecified whether esophagitis present Patient tolerates omeprazole well.  20 mg as needed.  3. Hyperlipidemia, unspecified hyperlipidemia type Has been diet controlled. - Comprehensive metabolic panel; Future - Lipid panel; Future - Lipid panel - Comprehensive metabolic panel  4. Bilateral bunions Following with Ortho.  5. Arthralgia,  unspecified joint We will add some additional lab work to make sure no autoimmune component to joint pains.  Follow-up pending these results. - CBC with Differential/Platelet; Future - Rheumatoid factor; Future - Sedimentation rate; Future - Uric acid; Future - C-reactive protein; Future - ANA; Future - ANA - C-reactive protein - Uric acid - Sedimentation rate - Rheumatoid factor - CBC with Differential/Platelet  6. History of colonic polyps - Ambulatory referral to Gastroenterology  7. Need for immunization against influenza - Flu Vaccine QUAD 6+ mos PF IM (Fluarix Quad PF)    Return for pending bloodwork.    Micheline Rough, MD

## 2020-04-17 LAB — LIPID PANEL
Cholesterol: 200 mg/dL — ABNORMAL HIGH (ref <200)
HDL: 51 mg/dL (ref >=50)
LDL Cholesterol (Calc): 124 mg/dL (calc) — ABNORMAL HIGH
Non-HDL Cholesterol (Calc): 149 mg/dL (calc) — ABNORMAL HIGH (ref <130)
Total CHOL/HDL Ratio: 3.9 (calc) (ref <5.0)
Triglycerides: 133 mg/dL (ref <150)

## 2020-04-17 LAB — COMPREHENSIVE METABOLIC PANEL
AG Ratio: 1.3 (calc) (ref 1.0–2.5)
ALT: 11 U/L (ref 6–29)
AST: 18 U/L (ref 10–35)
Albumin: 4.4 g/dL (ref 3.6–5.1)
Alkaline phosphatase (APISO): 109 U/L (ref 37–153)
BUN/Creatinine Ratio: 10 (calc) (ref 6–22)
BUN: 11 mg/dL (ref 7–25)
CO2: 27 mmol/L (ref 20–32)
Calcium: 9.7 mg/dL (ref 8.6–10.4)
Chloride: 104 mmol/L (ref 98–110)
Creat: 1.07 mg/dL — ABNORMAL HIGH (ref 0.50–1.05)
Globulin: 3.3 g/dL (calc) (ref 1.9–3.7)
Glucose, Bld: 82 mg/dL (ref 65–99)
Potassium: 4.2 mmol/L (ref 3.5–5.3)
Sodium: 140 mmol/L (ref 135–146)
Total Bilirubin: 0.8 mg/dL (ref 0.2–1.2)
Total Protein: 7.7 g/dL (ref 6.1–8.1)

## 2020-04-17 LAB — CBC WITH DIFFERENTIAL/PLATELET
Absolute Monocytes: 688 cells/uL (ref 200–950)
Basophils Absolute: 43 cells/uL (ref 0–200)
Basophils Relative: 0.5 %
Eosinophils Absolute: 43 cells/uL (ref 15–500)
Eosinophils Relative: 0.5 %
HCT: 42.4 % (ref 35.0–45.0)
Hemoglobin: 14.2 g/dL (ref 11.7–15.5)
Lymphs Abs: 2795 cells/uL (ref 850–3900)
MCH: 30.7 pg (ref 27.0–33.0)
MCHC: 33.5 g/dL (ref 32.0–36.0)
MCV: 91.6 fL (ref 80.0–100.0)
MPV: 10.2 fL (ref 7.5–12.5)
Monocytes Relative: 8 %
Neutro Abs: 5031 cells/uL (ref 1500–7800)
Neutrophils Relative %: 58.5 %
Platelets: 274 10*3/uL (ref 140–400)
RBC: 4.63 10*6/uL (ref 3.80–5.10)
RDW: 12.1 % (ref 11.0–15.0)
Total Lymphocyte: 32.5 %
WBC: 8.6 10*3/uL (ref 3.8–10.8)

## 2020-04-17 LAB — T4, FREE: Free T4: 1 ng/dL (ref 0.8–1.8)

## 2020-04-17 LAB — RHEUMATOID FACTOR: Rheumatoid fact SerPl-aCnc: <14 IU/mL (ref <14)

## 2020-04-17 LAB — URIC ACID: Uric Acid, Serum: 4.9 mg/dL (ref 2.5–7.0)

## 2020-04-17 LAB — TSH: TSH: 2.41 mIU/L (ref 0.40–4.50)

## 2020-04-17 LAB — SEDIMENTATION RATE: Sed Rate: 9 mm/h (ref 0–30)

## 2020-04-17 LAB — C-REACTIVE PROTEIN: CRP: 3.6 mg/L (ref <8.0)

## 2020-04-17 LAB — T3, FREE: T3, Free: 3 pg/mL (ref 2.3–4.2)

## 2020-04-17 LAB — ANA: Anti Nuclear Antibody (ANA): NEGATIVE

## 2020-04-18 ENCOUNTER — Encounter: Payer: Self-pay | Admitting: Internal Medicine

## 2020-04-19 NOTE — Progress Notes (Signed)
noted 

## 2020-05-31 ENCOUNTER — Other Ambulatory Visit: Payer: Self-pay

## 2020-05-31 ENCOUNTER — Ambulatory Visit (AMBULATORY_SURGERY_CENTER): Payer: Self-pay | Admitting: *Deleted

## 2020-05-31 VITALS — Ht 65.5 in | Wt 214.0 lb

## 2020-05-31 DIAGNOSIS — Z86010 Personal history of colonic polyps: Secondary | ICD-10-CM

## 2020-05-31 NOTE — Progress Notes (Signed)
Fully vax'd ° °No egg or soy allergy known to patient  °No issues with past sedation with any surgeries or procedures °no intubation problems in the past  °No FH of Malignant Hyperthermia °No diet pills per patient °No home 02 use per patient  °No blood thinners per patient  °Pt denies issues with constipation  °No A fib or A flutter  °EMMI video to pt or via MyChart  °COVID 19 guidelines implemented in [facility] today with Pt and RN  ° ° °Due to the COVID-19 pandemic we are asking patients to follow these guidelines. Please only bring one care partner. Please be aware that your care partner may wait in the car in the parking lot or if they feel like they will be too hot to wait in the car, they may wait in the lobby on the 4th floor. All care partners are required to wear a mask the entire time (we do not have any that we can provide them), they need to practice social distancing, and we will do a Covid check for all patient's and care partners when you arrive. Also we will check their temperature and your temperature. If the care partner waits in their car they need to stay in the parking lot the entire time and we will call them on their cell phone when the patient is ready for discharge so they can bring the car to the front of the building. Also all patient's will need to wear a mask into building. ° °

## 2020-06-07 ENCOUNTER — Encounter: Payer: Self-pay | Admitting: Internal Medicine

## 2020-06-09 ENCOUNTER — Telehealth: Payer: Self-pay

## 2020-06-09 NOTE — Telephone Encounter (Addendum)
New instructions have been sent to the patient via MyChart and have also been mailed to the patient at address currently listed in the chart; ----- Message from Elna Breslow sent at 06/09/2020 11:14 AM EST ----- Regarding: Prep Instructions Pt rescheduled appt could you please resend new prep instructions with correct date and times Thank you  Lia.

## 2020-06-14 ENCOUNTER — Encounter: Payer: BLUE CROSS/BLUE SHIELD | Admitting: Internal Medicine

## 2020-07-01 ENCOUNTER — Other Ambulatory Visit: Payer: BLUE CROSS/BLUE SHIELD

## 2020-07-01 ENCOUNTER — Other Ambulatory Visit: Payer: Self-pay

## 2020-07-01 DIAGNOSIS — Z20822 Contact with and (suspected) exposure to covid-19: Secondary | ICD-10-CM | POA: Diagnosis not present

## 2020-07-05 DIAGNOSIS — F3342 Major depressive disorder, recurrent, in full remission: Secondary | ICD-10-CM | POA: Diagnosis not present

## 2020-07-05 LAB — NOVEL CORONAVIRUS, NAA: SARS-CoV-2, NAA: NOT DETECTED

## 2020-07-06 ENCOUNTER — Ambulatory Visit (INDEPENDENT_AMBULATORY_CARE_PROVIDER_SITE_OTHER): Payer: BLUE CROSS/BLUE SHIELD | Admitting: Family Medicine

## 2020-07-06 ENCOUNTER — Other Ambulatory Visit: Payer: Self-pay

## 2020-07-06 VITALS — BP 118/66 | HR 85 | Temp 97.7°F | Ht 65.0 in | Wt 213.4 lb

## 2020-07-06 DIAGNOSIS — R300 Dysuria: Secondary | ICD-10-CM | POA: Diagnosis not present

## 2020-07-06 LAB — POCT URINALYSIS DIPSTICK
Bilirubin, UA: NEGATIVE
Glucose, UA: NEGATIVE
Ketones, UA: NEGATIVE
Nitrite, UA: POSITIVE
Protein, UA: POSITIVE — AB
Spec Grav, UA: 1.025 (ref 1.010–1.025)
Urobilinogen, UA: 0.2 E.U./dL
pH, UA: 6 (ref 5.0–8.0)

## 2020-07-06 MED ORDER — CEPHALEXIN 500 MG PO CAPS: 500.0000 mg | ORAL_CAPSULE | Freq: Three times a day (TID) | ORAL | 0 refills | Status: DC

## 2020-07-06 NOTE — Progress Notes (Signed)
Established Patient Office Visit  Subjective:  Patient ID: Claudia Warner, female    DOB: 1960-10-27  Age: 60 y.o. MRN: [id number]  CC:  Chief Complaint  Patient presents with  . Urinary Tract Infection    Possible UTI, urine odor, cloudy, little urgency sometimes symptoms x 4 days.     HPI Claudia Warner presents for 4-day history of dysuria.  She has had some urinary urgency and frequency, increased odor, and cloudy urine.  Occasional mild burning with urination.  Denies any fever.  No flank pain.  No nausea or vomiting.  No history of any recent UTI symptoms.  No abdominal pain.  Only listed drug allergy is Sudafed.  Past Medical History:  Diagnosis Date  . ALLERGIC RHINITIS 09/23/2007   Qualifier: Diagnosis of  By: Lenna Gilford MD, Deborra Medina   . Allergy   . Asthmatic bronchitis    UARS (upper airway resistance syndrome)  . Cataract    forming   . DEPRESSION 09/23/2007   Qualifier: Diagnosis of  By: Lenna Gilford MD, Deborra Medina   . Genital herpes 06/03/2014  . GERD (gastroesophageal reflux disease)   . Hot flashes 06/03/2014  . HYPERCHOLESTEROLEMIA, BORDERLINE 09/23/2007   Qualifier: Diagnosis of  By: Lenna Gilford MD, Deborra Medina   . Palpitations   . Personal history of colonic polyps-adenoma 10/23/2011   Diminutive cecal tubular adenoma 09/2011 Carlean Purl)   . Uterine fibroid     Past Surgical History:  Procedure Laterality Date  . COLONOSCOPY    . POLYPECTOMY    . WISDOM TOOTH EXTRACTION  1990    Family History  Problem Relation Age of Onset  . Heart disease Father        chf  . Pulmonary Hypertension Father   . Kidney failure Father   . Hodgkin's lymphoma Sister   . Colonic polyp Sister        ? polyposis per pt - pt ? lynch syndrome   . Colon polyps Sister   . Colon cancer Maternal Aunt 46  . Uterine cancer Maternal Aunt   . Heart disease Mother        a fib  . Vascular Disease Mother   . Dementia Mother 5  . Atrial fibrillation Mother   . Stroke Mother   . Uterine cancer Maternal Aunt         ovarian  . Melanoma Maternal Grandmother   . Stomach cancer Neg Hx   . Esophageal cancer Neg Hx   . Rectal cancer Neg Hx     Social History   Socioeconomic History  . Marital status: Married    Spouse name: Not on file  . Number of children: Not on file  . Years of education: Not on file  . Highest education level: Not on file  Occupational History  . Occupation: Retired  Tobacco Use  . Smoking status: Former Smoker    Packs/day: 0.25    Years: 2.00    Pack years: 0.50    Types: Cigarettes    Quit date: 06/25/1984    Years since quitting: 36.0  . Smokeless tobacco: Never Used  Substance and Sexual Activity  . Alcohol use: No  . Drug use: No  . Sexual activity: Not on file  Other Topics Concern  . Not on file  Social History Narrative   Work or School: lost her job 10 years ago, stays at home now   No children      1 caffeine daily  Home Situation: lives with husband      Spiritual Beliefs: Christian - [employer]      Lifestyle: walking 3 days per week; diet is ok      Social Determinants of Radio broadcast assistant Strain: Not on file  Food Insecurity: Not on file  Transportation Needs: Not on file  Physical Activity: Not on file  Stress: Not on file  Social Connections: Not on file  Intimate Partner Violence: Not on file    Outpatient Medications Prior to Visit  Medication Sig Dispense Refill  . buPROPion (WELLBUTRIN XL) 150 MG 24 hr tablet Take 150 mg by mouth daily.    Marland Kitchen escitalopram (LEXAPRO) 10 MG tablet Take 5 mg by mouth daily.    Marland Kitchen gabapentin (NEURONTIN) 300 MG capsule Take 300 mg by mouth daily.     . Multiple Vitamins-Minerals (CENTRUM SILVER 50+WOMEN PO) Take by mouth.    Marland Kitchen omeprazole (PRILOSEC) 20 MG capsule TAKE 1 CAPSULE DAILY (Patient taking differently: Take 20 mg by mouth daily as needed.) 90 capsule 1  . valACYclovir (VALTREX) 1000 MG tablet daily as needed. Take as directed    . triamcinolone cream (KENALOG) 0.1 % Apply 1  application topically 2 (two) times daily. (Patient not taking: Reported on 07/06/2020) 30 g 0  . venlafaxine XR (EFFEXOR-XR) 75 MG 24 hr capsule      No facility-administered medications prior to visit.    Allergies  Allergen Reactions  . Sudafed [Pseudoephedrine Hcl]     Heart races    ROS Review of Systems  Constitutional: Negative for appetite change, chills, fever and unexpected weight change.  Gastrointestinal: Negative for abdominal pain, constipation, diarrhea, nausea and vomiting.  Genitourinary: Positive for dysuria and frequency. Negative for hematuria.  Musculoskeletal: Negative for back pain.  Neurological: Negative for dizziness.      Objective:    Physical Exam Vitals reviewed.  Constitutional:      Appearance: Normal appearance.  Cardiovascular:     Rate and Rhythm: Normal rate and regular rhythm.  Pulmonary:     Effort: Pulmonary effort is normal.     Breath sounds: Normal breath sounds.  Neurological:     Mental Status: She is alert.     BP 118/66   Pulse 85   Temp 97.7 F (36.5 C) (Oral)   Ht 5\' 5"  (1.651 m)   Wt 213 lb 6.4 oz (96.8 kg)   LMP 06/11/2012   SpO2 96%   BMI 35.51 kg/m  Wt Readings from Last 3 Encounters:  07/06/20 213 lb 6.4 oz (96.8 kg)  05/31/20 214 lb (97.1 kg)  04/13/20 214 lb 11.2 oz (97.4 kg)     Health Maintenance Due  Topic Date Due  . COLONOSCOPY (Pts 45-65yrs Insurance coverage will need to be confirmed)  02/24/2020  . TETANUS/TDAP  05/25/2020    There are no preventive care reminders to display for this patient.  Lab Results  Component Value Date   TSH 2.41 04/13/2020   Lab Results  Component Value Date   WBC 8.6 04/13/2020   HGB 14.2 04/13/2020   HCT 42.4 04/13/2020   MCV 91.6 04/13/2020   PLT 274 04/13/2020   Lab Results  Component Value Date   NA 140 04/13/2020   K 4.2 04/13/2020   CO2 27 04/13/2020   GLUCOSE 82 04/13/2020   BUN 11 04/13/2020   CREATININE 1.07 (H) 04/13/2020   BILITOT 0.8  04/13/2020   ALKPHOS 129 (H) 10/05/2019   AST 18  04/13/2020   ALT 11 04/13/2020   PROT 7.7 04/13/2020   ALBUMIN 4.3 10/05/2019   CALCIUM 9.7 04/13/2020   GFR 61.10 10/05/2019   Lab Results  Component Value Date   CHOL 200 (H) 04/13/2020   Lab Results  Component Value Date   HDL 51 04/13/2020   Lab Results  Component Value Date   LDLCALC 124 (H) 04/13/2020   Lab Results  Component Value Date   TRIG 133 04/13/2020   Lab Results  Component Value Date   CHOLHDL 3.9 04/13/2020   Lab Results  Component Value Date   HGBA1C 5.5 10/05/2019      Assessment & Plan:   Problem List Items Addressed This Visit   None   Visit Diagnoses    Dysuria    -  Primary   Relevant Orders   POC Urinalysis Dipstick (Completed)   Culture, Urine    Urine dipstick does read suggest infection with positive nitrites and leukocytes.  Urine culture sent.  Stay well-hydrated.  Start Keflex 500 mg 3 times daily for 5 days pending culture results  Meds ordered this encounter  Medications  . cephALEXin (KEFLEX) 500 MG capsule    Sig: Take 1 capsule (500 mg total) by mouth 3 (three) times daily.    Dispense:  15 capsule    Refill:  0    Follow-up: No follow-ups on file.    Evelena Peat, MD

## 2020-07-06 NOTE — Patient Instructions (Signed)
Urinary Tract Infection, Adult  A urinary tract infection (UTI) is an infection of any part of the urinary tract. The urinary tract includes the kidneys, ureters, bladder, and urethra. These organs make, store, and get rid of urine in the body. An upper UTI affects the ureters and kidneys. A lower UTI affects the bladder and urethra. What are the causes? Most urinary tract infections are caused by bacteria in your genital area around your urethra, where urine leaves your body. These bacteria grow and cause inflammation of your urinary tract. What increases the risk? You are more likely to develop this condition if:  You have a urinary catheter that stays in place.  You are not able to control when you urinate or have a bowel movement (incontinence).  You are female and you: ? Use a spermicide or diaphragm for birth control. ? Have low estrogen levels. ? Are pregnant.  You have certain genes that increase your risk.  You are sexually active.  You take antibiotic medicines.  You have a condition that causes your flow of urine to slow down, such as: ? An enlarged prostate, if you are female. ? Blockage in your urethra. ? A kidney stone. ? A nerve condition that affects your bladder control (neurogenic bladder). ? Not getting enough to drink, or not urinating often.  You have certain medical conditions, such as: ? Diabetes. ? A weak disease-fighting system (immunesystem). ? Sickle cell disease. ? Gout. ? Spinal cord injury. What are the signs or symptoms? Symptoms of this condition include:  Needing to urinate right away (urgency).  Frequent urination. This may include small amounts of urine each time you urinate.  Pain or burning with urination.  Blood in the urine.  Urine that smells bad or unusual.  Trouble urinating.  Cloudy urine.  Vaginal discharge, if you are female.  Pain in the abdomen or the lower back. You may also have:  Vomiting or a decreased  appetite.  Confusion.  Irritability or tiredness.  A fever or chills.  Diarrhea. The first symptom in older adults may be confusion. In some cases, they may not have any symptoms until the infection has worsened. How is this diagnosed? This condition is diagnosed based on your medical history and a physical exam. You may also have other tests, including:  Urine tests.  Blood tests.  Tests for STIs (sexually transmitted infections). If you have had more than one UTI, a cystoscopy or imaging studies may be done to determine the cause of the infections. How is this treated? Treatment for this condition includes:  Antibiotic medicine.  Over-the-counter medicines to treat discomfort.  Drinking enough water to stay hydrated. If you have frequent infections or have other conditions such as a kidney stone, you may need to see a health care provider who specializes in the urinary tract (urologist). In rare cases, urinary tract infections can cause sepsis. Sepsis is a life-threatening condition that occurs when the body responds to an infection. Sepsis is treated in the hospital with IV antibiotics, fluids, and other medicines. Follow these instructions at home: Medicines  Take over-the-counter and prescription medicines only as told by your health care provider.  If you were prescribed an antibiotic medicine, take it as told by your health care provider. Do not stop using the antibiotic even if you start to feel better. General instructions  Make sure you: ? Empty your bladder often and completely. Do not hold urine for long periods of time. ? Empty your bladder after   sex. ? Wipe from front to back after urinating or having a bowel movement if you are female. Use each tissue only one time when you wipe.  Drink enough fluid to keep your urine pale yellow.  Keep all follow-up visits. This is important.   Contact a health care provider if:  Your symptoms do not get better after 1-2  days.  Your symptoms go away and then return. Get help right away if:  You have severe pain in your back or your lower abdomen.  You have a fever or chills.  You have nausea or vomiting. Summary  A urinary tract infection (UTI) is an infection of any part of the urinary tract, which includes the kidneys, ureters, bladder, and urethra.  Most urinary tract infections are caused by bacteria in your genital area.  Treatment for this condition often includes antibiotic medicines.  If you were prescribed an antibiotic medicine, take it as told by your health care provider. Do not stop using the antibiotic even if you start to feel better.  Keep all follow-up visits. This is important. This information is not intended to replace advice given to you by your health care provider. Make sure you discuss any questions you have with your health care provider. Document Revised: 01/22/2020 Document Reviewed: 01/22/2020 Elsevier Patient Education  2021 [employer] Inc.  

## 2020-07-08 ENCOUNTER — Encounter: Payer: BLUE CROSS/BLUE SHIELD | Admitting: Internal Medicine

## 2020-07-08 LAB — URINE CULTURE
MICRO NUMBER:: 11409961
SPECIMEN QUALITY:: ADEQUATE

## 2020-07-12 ENCOUNTER — Telehealth: Payer: Self-pay | Admitting: *Deleted

## 2020-07-12 NOTE — Telephone Encounter (Signed)
Pt returned my call and was notified of lab results. She reports that her symptoms are resolving.

## 2020-08-02 ENCOUNTER — Ambulatory Visit (AMBULATORY_SURGERY_CENTER): Payer: BLUE CROSS/BLUE SHIELD | Admitting: Internal Medicine

## 2020-08-02 ENCOUNTER — Encounter: Payer: Self-pay | Admitting: Internal Medicine

## 2020-08-02 ENCOUNTER — Other Ambulatory Visit: Payer: Self-pay

## 2020-08-02 VITALS — BP 99/63 | HR 67 | Temp 97.3°F | Resp 13 | Ht 65.0 in | Wt 214.0 lb

## 2020-08-02 DIAGNOSIS — Z86010 Personal history of colonic polyps: Secondary | ICD-10-CM

## 2020-08-02 DIAGNOSIS — K635 Polyp of colon: Secondary | ICD-10-CM

## 2020-08-02 DIAGNOSIS — D123 Benign neoplasm of transverse colon: Secondary | ICD-10-CM

## 2020-08-02 DIAGNOSIS — Z1211 Encounter for screening for malignant neoplasm of colon: Secondary | ICD-10-CM | POA: Diagnosis not present

## 2020-08-02 DIAGNOSIS — D120 Benign neoplasm of cecum: Secondary | ICD-10-CM

## 2020-08-02 MED ORDER — SODIUM CHLORIDE 0.9 % IV SOLN: 500.0000 mL | Freq: Once | INTRAVENOUS | Status: DC

## 2020-08-02 NOTE — Progress Notes (Signed)
Called to room to assist during endoscopic procedure.  Patient ID and intended procedure confirmed with present staff. Received instructions for my participation in the procedure from the performing physician.  

## 2020-08-02 NOTE — Op Note (Signed)
[facility] Patient Name: Claudia Warner Procedure Date: 08/02/2020 2:23 PM MRN: [id number] Endoscopist: Gatha Mayer , MD Age: 60 Referring MD:  Date of Birth: Apr 23, 1961 Gender: Female Account #: [id number] Procedure:                Colonoscopy Indications:              Surveillance: Personal history of adenomatous                            polyps on last colonoscopy > 5 years ago Medicines:                Propofol per Anesthesia, Monitored Anesthesia Care Procedure:                Pre-Anesthesia Assessment:                           - Prior to the procedure, a History and Physical                            was performed, and patient medications and                            allergies were reviewed. The patient's tolerance of                            previous anesthesia was also reviewed. The risks                            and benefits of the procedure and the sedation                            options and risks were discussed with the patient.                            All questions were answered, and informed consent                            was obtained. Prior Anticoagulants: The patient has                            taken no previous anticoagulant or antiplatelet                            agents. ASA Grade Assessment: II - A patient with                            mild systemic disease. After reviewing the risks                            and benefits, the patient was deemed in                            satisfactory condition to undergo the procedure.  After obtaining informed consent, the colonoscope                            was passed under direct vision. Throughout the                            procedure, the patient's blood pressure, pulse, and                            oxygen saturations were monitored continuously. The                            Olympus PCF-H190DL (#[id number]) Colonoscope was                             introduced through the anus and advanced to the the                            cecum, identified by appendiceal orifice and                            ileocecal valve. The colonoscopy was performed                            without difficulty. The patient tolerated the                            procedure well. The quality of the bowel                            preparation was excellent. The bowel preparation                            used was Miralax via split dose instruction. The                            ileocecal valve, appendiceal orifice, and rectum                            were photographed. Scope In: 2:34:13 PM Scope Out: 2:53:44 PM Scope Withdrawal Time: 0 hours 14 minutes 2 seconds  Total Procedure Duration: 0 hours 19 minutes 31 seconds  Findings:                 The perianal and digital rectal examinations were                            normal.                           Two sessile polyps were found in the transverse                            colon and cecum. The polyps were 1 to 2 mm in size.  These polyps were removed with a cold biopsy                            forceps. Resection and retrieval were complete.                            Verification of patient identification for the                            specimen was done. Estimated blood loss was minimal.                           The exam was otherwise without abnormality on                            direct and retroflexion views. Complications:            No immediate complications. Estimated Blood Loss:     Estimated blood loss was minimal. Impression:               - Two 1 to 2 mm polyps in the transverse colon and                            in the cecum, removed with a cold biopsy forceps.                            Resected and retrieved.                           - The examination was otherwise normal on direct                            and retroflexion views.                            - Personal history of colonic polyps. Recommendation:           - Patient has a contact number available for                            emergencies. The signs and symptoms of potential                            delayed complications were discussed with the                            patient. Return to normal activities tomorrow.                            Written discharge instructions were provided to the                            patient.                           - Resume previous  diet.                           - Continue present medications.                           - Repeat colonoscopy is recommended for                            surveillance. The colonoscopy date will be                            determined after pathology results from today's                            exam become available for review. Gatha Mayer, MD 08/02/2020 3:04:35 PM This report has been signed electronically.

## 2020-08-02 NOTE — Patient Instructions (Addendum)
I found and removed just 2 tiny polyps.  I will let you know pathology results and when to have another routine colonoscopy by mail and/or My Chart.  I appreciate the opportunity to care for you. Gatha Mayer, MD, [facility]    Handout on polyps given to you today  Await pathology results     YOU HAD AN ENDOSCOPIC PROCEDURE TODAY AT [facility]:   Refer to the procedure report that was given to you for any specific questions about what was found during the examination.  If the procedure report does not answer your questions, please call your gastroenterologist to clarify.  If you requested that your care partner not be given the details of your procedure findings, then the procedure report has been included in a sealed envelope for you to review at your convenience later.  YOU SHOULD EXPECT: Some feelings of bloating in the abdomen. Passage of more gas than usual.  Walking can help get rid of the air that was put into your GI tract during the procedure and reduce the bloating. If you had a lower endoscopy (such as a colonoscopy or flexible sigmoidoscopy) you may notice spotting of blood in your stool or on the toilet paper. If you underwent a bowel prep for your procedure, you may not have a normal bowel movement for a few days.  Please Note:  You might notice some irritation and congestion in your nose or some drainage.  This is from the oxygen used during your procedure.  There is no need for concern and it should clear up in a day or so.  SYMPTOMS TO REPORT IMMEDIATELY:   Following lower endoscopy (colonoscopy or flexible sigmoidoscopy):  Excessive amounts of blood in the stool  Significant tenderness or worsening of abdominal pains  Swelling of the abdomen that is new, acute  Fever of 100F or higher    For urgent or emergent issues, a gastroenterologist can be reached at any hour by calling [phone]. Do not use MyChart messaging for urgent concerns.     DIET:  We do recommend a small meal at first, but then you may proceed to your regular diet.  Drink plenty of fluids but you should avoid alcoholic beverages for 24 hours.  ACTIVITY:  You should plan to take it easy for the rest of today and you should NOT DRIVE or use heavy machinery until tomorrow (because of the sedation medicines used during the test).    FOLLOW UP: Our staff will call the number listed on your records 48-72 hours following your procedure to check on you and address any questions or concerns that you may have regarding the information given to you following your procedure. If we do not reach you, we will leave a message.  We will attempt to reach you two times.  During this call, we will ask if you have developed any symptoms of COVID 19. If you develop any symptoms (ie: fever, flu-like symptoms, shortness of breath, cough etc.) before then, please call [phone].  If you test positive for Covid 19 in the 2 weeks post procedure, please call and report this information to [place].    If any biopsies were taken you will be contacted by phone or by letter within the next 1-3 weeks.  Please call us at [phone] if you have not heard about the biopsies in 3 weeks.    SIGNATURES/CONFIDENTIALITY: You and/or your care partner have signed paperwork which will be entered into  your electronic medical record.  These signatures attest to the fact that that the information above on your After Visit Summary has been reviewed and is understood.  Full responsibility of the confidentiality of this discharge information lies with you and/or your care-partner.

## 2020-08-02 NOTE — Progress Notes (Signed)
PT taken to PACU. Monitors in place. VSS. Report given to RN. 

## 2020-08-02 NOTE — Progress Notes (Signed)
SB vitals.

## 2020-08-04 ENCOUNTER — Telehealth: Payer: Self-pay

## 2020-08-04 NOTE — Telephone Encounter (Signed)
  Follow up Call-  Call back number 08/02/2020  Post procedure Call Back phone  # [phone]  Permission to leave phone message Yes  Some recent data might be hidden     Patient questions:  Do you have a fever, pain , or abdominal swelling? No. Pain Score  0 *  Have you tolerated food without any problems? Yes.    Have you been able to return to your normal activities? Yes.    Do you have any questions about your discharge instructions: Diet   No. Medications  No. Follow up visit  No.  Do you have questions or concerns about your Care? No.  Actions: * If pain score is 4 or above: 1. No action needed, pain <4.Have you developed a fever since your procedure? no  2.   Have you had an respiratory symptoms (SOB or cough) since your procedure? no  3.   Have you tested positive for COVID 19 since your procedure no  4.   Have you had any family members/close contacts diagnosed with the COVID 19 since your procedure?  no   If yes to any of these questions please route to Joylene John, RN and Joella Prince, RN

## 2020-08-11 ENCOUNTER — Encounter: Payer: Self-pay | Admitting: Internal Medicine

## 2020-09-16 DIAGNOSIS — H35411 Lattice degeneration of retina, right eye: Secondary | ICD-10-CM | POA: Diagnosis not present

## 2020-09-16 DIAGNOSIS — H2513 Age-related nuclear cataract, bilateral: Secondary | ICD-10-CM | POA: Diagnosis not present

## 2020-09-16 DIAGNOSIS — H52203 Unspecified astigmatism, bilateral: Secondary | ICD-10-CM | POA: Diagnosis not present

## 2024-06-11 ENCOUNTER — Encounter: Payer: Self-pay | Admitting: Family Medicine

## 2024-06-11 ENCOUNTER — Ambulatory Visit: Payer: Self-pay | Admitting: Family Medicine

## 2024-06-11 VITALS — BP 114/80 | HR 80 | Temp 98.0°F | Resp 16 | Ht 65.0 in | Wt 213.0 lb

## 2024-06-11 DIAGNOSIS — B009 Herpesviral infection, unspecified: Secondary | ICD-10-CM | POA: Insufficient documentation

## 2024-06-11 DIAGNOSIS — Z7689 Persons encountering health services in other specified circumstances: Secondary | ICD-10-CM

## 2024-06-11 DIAGNOSIS — M1990 Unspecified osteoarthritis, unspecified site: Secondary | ICD-10-CM | POA: Insufficient documentation

## 2024-06-11 DIAGNOSIS — R232 Flushing: Secondary | ICD-10-CM

## 2024-06-11 DIAGNOSIS — F3342 Major depressive disorder, recurrent, in full remission: Secondary | ICD-10-CM

## 2024-06-11 DIAGNOSIS — K219 Gastro-esophageal reflux disease without esophagitis: Secondary | ICD-10-CM

## 2024-06-11 DIAGNOSIS — E785 Hyperlipidemia, unspecified: Secondary | ICD-10-CM

## 2024-06-11 MED ORDER — VALACYCLOVIR HCL 1 G PO TABS: 1000.0000 mg | ORAL_TABLET | Freq: Two times a day (BID) | ORAL | 3 refills | Status: AC

## 2024-06-11 NOTE — Progress Notes (Signed)
 Established Patient Office Visit   Subjective  Patient ID: Claudia Warner, female    DOB: [birth year]  Age: 63 y.o. MRN: [id number]  Chief Complaint  Patient presents with   Annual Exam    Patient is a 63 year old female seen for est care and f/u.  Patient previously seen by Dr. Luke and Dr. Koberlein.  H/o depression: x yrs.  Managed with bupropion and venlafaxine.  Followed by Dr. Vincente.  GERD: omeprazole  prn  H/o HSV:  No recent outbreaks.  Occur infrequently.  Takes valtrex  if needed.  Hasn't had an rx in a while.  She experiences hot flashes, particularly during the daytime, exacerbated by being overweight and sometimes by caffeine. She previously took estrogen and progesterone for five years but discontinued after developing a uterine polyp, which was removed. She has tried gabapentin in the past but is not currently on it.  Arthritis in knees, feet, and back.  Occasionally takes ibuprofen.   Headaches- primarily localized, sometimes attributed to neck issues. She takes ibuprofen for severe headaches. No h/o migraines.  H/o seasonal allergies.   Can trigger headaches.   H/o HLD.  Mentions hot flash symptoms.  Previously on estrogen and progesterone however meds DC'd after causing a uterine polyp.  Patient had polyp removed.  Also tried gabapentin in the past.  Has never tried OTC supplements.  Denies night sweats.    Allergies: Sudafed-heart races.  Social hx: Married for 29 years, has no children, and previously worked in armed forces operational officer. She has not used tobacco or alcohol since her early thirties.   Her family history includes skin cancer in her mother, non-Hodgkin's lymphoma in her brother Chyrl, and melanoma in her maternal grandmother. Her sister Devere had a disease in high school.     Patient Active Problem List   Diagnosis Date Noted   Bilateral carpal tunnel syndrome 01/16/2016   Recurrent major depressive disorder, in full remission 05/24/2015   Genital herpes  06/03/2014   Hot flashes 06/03/2014   Fibroids 06/03/2014   Constipation 06/03/2014   History of colonic polyps 10/23/2011   Hyperlipemia 09/23/2007   Allergies 09/23/2007   GERD 09/23/2007   Past Medical History:  Diagnosis Date   ALLERGIC RHINITIS 09/23/2007   Qualifier: Diagnosis of  By: Christi MD, Glendia CHRISTELLA    Allergy    Asthmatic bronchitis    UARS (upper airway resistance syndrome)   Cataract    forming    DEPRESSION 09/23/2007   Qualifier: Diagnosis of  By: Christi MD, Glendia CHRISTELLA    Genital herpes 06/03/2014   GERD (gastroesophageal reflux disease)    Hot flashes 06/03/2014   HYPERCHOLESTEROLEMIA, BORDERLINE 09/23/2007   Qualifier: Diagnosis of  By: Christi MD, Glendia CHRISTELLA    Palpitations    Personal history of colonic polyps-adenoma 10/23/2011   Diminutive cecal tubular adenoma 09/2011 [place])    Uterine fibroid    Past Surgical History:  Procedure Laterality Date   COLONOSCOPY     POLYPECTOMY     WISDOM TOOTH EXTRACTION  1990   Social History[1] Family History  Problem Relation Age of Onset   Heart disease Father        chf   Pulmonary Hypertension Father    Kidney failure Father    Hodgkin's lymphoma Sister    Colonic polyp Sister        ? polyposis per pt - pt ? lynch syndrome    Colon polyps Sister    Colon cancer Maternal Aunt 50  Uterine cancer Maternal Aunt    Heart disease Mother        a fib   Vascular Disease Mother    Dementia Mother 64   Atrial fibrillation Mother    Stroke Mother    Uterine cancer Maternal Aunt        ovarian   Melanoma Maternal Grandmother    Stomach cancer Neg Hx    Esophageal cancer Neg Hx    Rectal cancer Neg Hx    Allergies[2]  ROS Negative unless stated above    Objective:     BP 114/80 (BP Location: Left Arm, Patient Position: Sitting, Cuff Size: Large)   Pulse 80   Temp 98 F (36.7 C) (Oral)   Resp 16   Ht 5' 5 (1.651 m)   Wt 213 lb (96.6 kg)   LMP 06/11/2012   SpO2 94%   BMI 35.45 kg/m  BP Readings from  Last 3 Encounters:  06/11/24 114/80  08/02/20 99/63  07/06/20 118/66   Wt Readings from Last 3 Encounters:  06/11/24 213 lb (96.6 kg)  08/02/20 214 lb (97.1 kg)  07/06/20 213 lb 6.4 oz (96.8 kg)      Physical Exam Constitutional:      General: She is not in acute distress.    Appearance: Normal appearance.  HENT:     Head: Normocephalic and atraumatic.     Nose: Nose normal.     Mouth/Throat:     Mouth: Mucous membranes are moist.  Cardiovascular:     Rate and Rhythm: Normal rate and regular rhythm.     Heart sounds: Normal heart sounds. No murmur heard.    No gallop.  Pulmonary:     Effort: Pulmonary effort is normal. No respiratory distress.     Breath sounds: Normal breath sounds. No wheezing, rhonchi or rales.  Skin:    General: Skin is warm and dry.  Neurological:     Mental Status: She is alert and oriented to person, place, and time.        06/11/2024    8:58 AM 03/13/2019    1:50 PM 06/02/2018    9:30 AM  Depression screen PHQ 2/9  Decreased Interest 1 0 0  Down, Depressed, Hopeless  0 0  PHQ - 2 Score 1 0 0  Altered sleeping 0 0 0  Tired, decreased energy 1 0 1  Change in appetite 0 0 0  Feeling bad or failure about yourself   0 0  Trouble concentrating 1 0 0  Moving slowly or fidgety/restless 0 0 0  Suicidal thoughts 0 0 0  PHQ-9 Score 3 0  1   Difficult doing work/chores Somewhat difficult       Data saved with a previous flowsheet row definition      06/11/2024    8:59 AM  GAD 7 : Generalized Anxiety Score  Nervous, Anxious, on Edge 0  Control/stop worrying 0  Worry too much - different things 0  Trouble relaxing 0  Restless 0  Easily annoyed or irritable 0  Afraid - awful might happen 0  Total GAD 7 Score 0     No results found for any visits on 06/11/24.    Assessment & Plan:   Gastroesophageal reflux disease, unspecified whether esophagitis present  Arthritis  HSV infection -     valACYclovir  HCl; Take 1 tablet (1,000 mg  total) by mouth 2 (two) times daily for 10 days.  Dispense: 20 tablet; Refill: 3  Recurrent major  depressive disorder, in full remission  Hot flashes  Hyperlipidemia, unspecified hyperlipidemia type  Encounter to establish care   GERD stable.  Continue OTC omeprazole  as needed.  Avoid foods known to cause symptoms.  Arthritis in multiple joints.  Discussed supportive care including Tylenol/NSAIDs, heat, massage, topical analgesics, etc.  Follow-up with Ortho as needed.  History of HSV with infrequent outbreaks.  Valtrex  Rx refilled as needed for outbreak.  History of depression, stable.  PHQ-9 score 3 this visit, GAD-7 score 0.  Continue Wellbutrin XL 300 mg daily and venlafaxine XR 150 mg daily.  Continue follow-up with Dr. Vincente.  Consider trying OTC supplement such as Estroven or black cohosh for hot flash symptoms related to menopause.  History of HLD.  Lifestyle modifications encouraged.  Will check lipid panel and other labs at CPE to be scheduled at patient's convenience.  Return CPE.   Clotilda JONELLE Single, MD      [1]  Social History Tobacco Use   Smoking status: Former    Current packs/day: 0.00    Average packs/day: 0.3 packs/day for 2.0 years (0.5 ttl pk-yrs)    Types: Cigarettes    Start date: 06/25/1982    Quit date: 06/25/1984    Years since quitting: 39.9   Smokeless tobacco: Never  Substance Use Topics   Alcohol use: No   Drug use: No  [2]  Allergies Allergen Reactions   Sudafed [Pseudoephedrine Hcl]     Heart races

## 2024-06-11 NOTE — Patient Instructions (Addendum)
 It was nice meeting you today.  You can try over-the-counter Estroven or black cohosh for hot flash symptoms.  You can schedule a physical at your convenience.

## 2024-07-03 ENCOUNTER — Ambulatory Visit: Payer: Self-pay | Admitting: Family Medicine

## 2024-07-03 ENCOUNTER — Encounter: Payer: Self-pay | Admitting: Family Medicine

## 2024-07-03 VITALS — BP 118/78 | HR 100 | Temp 98.1°F | Ht 64.96 in | Wt 209.8 lb

## 2024-07-03 DIAGNOSIS — Z23 Encounter for immunization: Secondary | ICD-10-CM

## 2024-07-03 DIAGNOSIS — H6123 Impacted cerumen, bilateral: Secondary | ICD-10-CM | POA: Diagnosis not present

## 2024-07-03 DIAGNOSIS — M25511 Pain in right shoulder: Secondary | ICD-10-CM | POA: Diagnosis not present

## 2024-07-03 DIAGNOSIS — R1310 Dysphagia, unspecified: Secondary | ICD-10-CM | POA: Diagnosis not present

## 2024-07-03 DIAGNOSIS — Z0000 Encounter for general adult medical examination without abnormal findings: Secondary | ICD-10-CM

## 2024-07-03 DIAGNOSIS — K219 Gastro-esophageal reflux disease without esophagitis: Secondary | ICD-10-CM

## 2024-07-03 LAB — T4, FREE: Free T4: 0.86 ng/dL (ref 0.60–1.60)

## 2024-07-03 LAB — LIPID PANEL
Cholesterol: 204 mg/dL — ABNORMAL HIGH (ref 28–200)
HDL: 50.3 mg/dL
LDL Cholesterol: 128 mg/dL — ABNORMAL HIGH (ref 10–99)
NonHDL: 153.41
Total CHOL/HDL Ratio: 4
Triglycerides: 125 mg/dL (ref 10.0–149.0)
VLDL: 25 mg/dL (ref 0.0–40.0)

## 2024-07-03 LAB — TSH: TSH: 3.13 u[IU]/mL (ref 0.35–5.50)

## 2024-07-03 LAB — CBC WITH DIFFERENTIAL/PLATELET
Basophils Absolute: 0.1 K/uL (ref 0.0–0.1)
Basophils Relative: 0.8 % (ref 0.0–3.0)
Eosinophils Absolute: 0 K/uL (ref 0.0–0.7)
Eosinophils Relative: 0.5 % (ref 0.0–5.0)
HCT: 45.6 % (ref 36.0–46.0)
Hemoglobin: 15.2 g/dL — ABNORMAL HIGH (ref 12.0–15.0)
Lymphocytes Relative: 30.3 % (ref 12.0–46.0)
Lymphs Abs: 2.8 K/uL (ref 0.7–4.0)
MCHC: 33.4 g/dL (ref 30.0–36.0)
MCV: 91.9 fl (ref 78.0–100.0)
Monocytes Absolute: 0.8 K/uL (ref 0.1–1.0)
Monocytes Relative: 8.6 % (ref 3.0–12.0)
Neutro Abs: 5.6 K/uL (ref 1.4–7.7)
Neutrophils Relative %: 59.8 % (ref 43.0–77.0)
Platelets: 320 K/uL (ref 150.0–400.0)
RBC: 4.96 Mil/uL (ref 3.87–5.11)
RDW: 13.5 % (ref 11.5–15.5)
WBC: 9.4 K/uL (ref 4.0–10.5)

## 2024-07-03 LAB — COMPREHENSIVE METABOLIC PANEL WITH GFR
ALT: 18 U/L (ref 3–35)
AST: 20 U/L (ref 5–37)
Albumin: 4.6 g/dL (ref 3.5–5.2)
Alkaline Phosphatase: 132 U/L — ABNORMAL HIGH (ref 39–117)
BUN: 14 mg/dL (ref 6–23)
CO2: 30 meq/L (ref 19–32)
Calcium: 9.8 mg/dL (ref 8.4–10.5)
Chloride: 101 meq/L (ref 96–112)
Creatinine, Ser: 1.04 mg/dL (ref 0.40–1.20)
GFR: 57.39 mL/min — ABNORMAL LOW
Glucose, Bld: 86 mg/dL (ref 70–99)
Potassium: 4.5 meq/L (ref 3.5–5.1)
Sodium: 138 meq/L (ref 135–145)
Total Bilirubin: 0.8 mg/dL (ref 0.2–1.2)
Total Protein: 8.6 g/dL — ABNORMAL HIGH (ref 6.0–8.3)

## 2024-07-03 LAB — HEMOGLOBIN A1C: Hgb A1c MFr Bld: 5.6 % (ref 4.6–6.5)

## 2024-07-03 LAB — VITAMIN B12: Vitamin B-12: 454 pg/mL (ref 211–911)

## 2024-07-03 NOTE — Progress Notes (Signed)
 "  Established Patient Office Visit   Subjective  Patient ID: Claudia Warner, female    DOB: May 15, 1961  Age: 64 y.o. MRN: [id number]  Chief Complaint  Patient presents with   Annual Exam    Right shoulder pain, started 2 weeks ago, rate of pain 5 out of 10     Pt is a 64 yo female seen for CPE.  Pt with R shoulder pain x 2 wks.  Pain with reaching overhead >85% and behind back.  Pain is a sharp sensation 5/10.  Ibuprofen helps but does not like taking medicine if not needed.  Denies injury or increased activity.  Patient is right-handed.  Sleeps on sides L>R.  Difficulty swallowing medication all at once.  Now having to take pills individually.  Denies dysphagia with food or liquids.  Feels like something is in throat while sitting.  Patient has history of GERD.  Taking omeprazole  intermittently.  Denies sour acid taste in mouth, hoarse voice, burning in chest.    Patient Active Problem List   Diagnosis Date Noted   HSV infection 06/11/2024   Arthritis 06/11/2024   Bilateral carpal tunnel syndrome 01/16/2016   Recurrent major depressive disorder, in full remission 05/24/2015   Genital herpes 06/03/2014   Hot flashes 06/03/2014   Fibroids 06/03/2014   Constipation 06/03/2014   History of colonic polyps 10/23/2011   Hyperlipemia 09/23/2007   Allergies 09/23/2007   GERD 09/23/2007   Past Medical History:  Diagnosis Date   ALLERGIC RHINITIS 09/23/2007   Qualifier: Diagnosis of  By: Christi MD, Glendia CHRISTELLA    Allergy    Asthmatic bronchitis    UARS (upper airway resistance syndrome)   Cataract    forming    DEPRESSION 09/23/2007   Qualifier: Diagnosis of  By: Christi MD, Glendia CHRISTELLA    Genital herpes 06/03/2014   GERD (gastroesophageal reflux disease)    Hot flashes 06/03/2014   HYPERCHOLESTEROLEMIA, BORDERLINE 09/23/2007   Qualifier: Diagnosis of  By: Christi MD, Glendia CHRISTELLA    Palpitations    Personal history of colonic polyps-adenoma 10/23/2011   Diminutive cecal tubular adenoma 09/2011  [facility])    Uterine fibroid    Past Surgical History:  Procedure Laterality Date   COLONOSCOPY     POLYPECTOMY     WISDOM TOOTH EXTRACTION  1990   Social History[1] Family History  Problem Relation Age of Onset   Heart disease Father        chf   Pulmonary Hypertension Father    Kidney failure Father    Hodgkin's lymphoma Sister    Colonic polyp Sister        ? polyposis per pt - pt ? lynch syndrome    Colon polyps Sister    Colon cancer Maternal Aunt 60   Uterine cancer Maternal Aunt    Heart disease Mother        a fib   Vascular Disease Mother    Dementia Mother 4   Atrial fibrillation Mother    Stroke Mother    Uterine cancer Maternal Aunt        ovarian   Melanoma Maternal Grandmother    Stomach cancer Neg Hx    Esophageal cancer Neg Hx    Rectal cancer Neg Hx    Allergies[2]  ROS Negative unless stated above    Objective:     BP 118/78 (BP Location: Left Arm, Patient Position: Sitting, Cuff Size: Large)   Pulse 100   Temp 98.1 F (36.7  C) (Oral)   Ht 5' 4.96 (1.65 m)   Wt 209 lb 12.8 oz (95.2 kg)   LMP 06/11/2012   SpO2 97%   BMI 34.95 kg/m  BP Readings from Last 3 Encounters:  07/03/24 118/78  06/11/24 114/80  08/02/20 99/63   Wt Readings from Last 3 Encounters:  07/03/24 209 lb 12.8 oz (95.2 kg)  06/11/24 213 lb (96.6 kg)  08/02/20 214 lb (97.1 kg)      Physical Exam Constitutional:      Appearance: Normal appearance.  HENT:     Head: Normocephalic and atraumatic.     Right Ear: Hearing, ear canal and external ear normal. There is impacted cerumen.     Left Ear: Hearing, ear canal and external ear normal. There is impacted cerumen.     Ears:     Comments: B/l cerumen impaction.  Cerumen remaining in right ear after irrigation.    Nose: Nose normal.     Mouth/Throat:     Mouth: Mucous membranes are moist.     Pharynx: No oropharyngeal exudate or posterior oropharyngeal erythema.  Eyes:     General: No scleral icterus.     Extraocular Movements: Extraocular movements intact.     Conjunctiva/sclera: Conjunctivae normal.     Pupils: Pupils are equal, round, and reactive to light.  Neck:     Thyroid: No thyromegaly.     Vascular: No carotid bruit.  Cardiovascular:     Rate and Rhythm: Normal rate and regular rhythm.     Pulses: Normal pulses.     Heart sounds: Normal heart sounds. No murmur heard.    No friction rub.  Pulmonary:     Effort: Pulmonary effort is normal.     Breath sounds: Normal breath sounds. No wheezing, rhonchi or rales.  Abdominal:     General: Bowel sounds are normal.     Palpations: Abdomen is soft.     Tenderness: There is no abdominal tenderness.  Musculoskeletal:        General: No deformity. Normal range of motion.     Right shoulder: Bony tenderness present. No swelling.     Left shoulder: Normal.     Comments: TTP of R AC joint. + R crossarm.  No TTP of long head biceps brachii.  Lymphadenopathy:     Cervical: No cervical adenopathy.  Skin:    General: Skin is warm and dry.     Findings: No lesion.  Neurological:     General: No focal deficit present.     Mental Status: She is alert and oriented to person, place, and time.  Psychiatric:        Mood and Affect: Mood normal.        Thought Content: Thought content normal.        06/11/2024    8:58 AM 03/13/2019    1:50 PM 06/02/2018    9:30 AM  Depression screen PHQ 2/9  Decreased Interest 1 0 0  Down, Depressed, Hopeless  0 0  PHQ - 2 Score 1 0 0  Altered sleeping 0 0 0  Tired, decreased energy 1 0 1  Change in appetite 0 0 0  Feeling bad or failure about yourself   0 0  Trouble concentrating 1 0 0  Moving slowly or fidgety/restless 0 0 0  Suicidal thoughts 0 0 0  PHQ-9 Score 3 0  1   Difficult doing work/chores Somewhat difficult       Data saved with a previous  flowsheet row definition      06/11/2024    8:59 AM  GAD 7 : Generalized Anxiety Score  Nervous, Anxious, on Edge 0  Control/stop worrying 0   Worry too much - different things 0  Trouble relaxing 0  Restless 0  Easily annoyed or irritable 0  Afraid - awful might happen 0  Total GAD 7 Score 0     No results found for any visits on 07/03/24.    Assessment & Plan:   Well adult exam -     CBC with Differential/Platelet; Future -     Comprehensive metabolic panel with GFR; Future -     Hemoglobin A1c; Future -     Lipid panel; Future -     T4, free; Future -     TSH; Future  Bilateral impacted cerumen  Dysphagia, unspecified type  Arthralgia of right acromioclavicular joint  Need for Tdap vaccination -     Tdap vaccine greater than or equal to 7yo IM  Need for vaccination for Strep pneumoniae -     Pneumococcal conjugate vaccine 20-valent  Gastroesophageal reflux disease, unspecified whether esophagitis present -     Vitamin B12; Future  Age appropriate health screenings discussed.  Obtain labs.  Immunizations reviewed.  Tdap and pneumonia vaccines given this visit.  Patient to schedule shingles vaccine dose 1 at a later date.  Dysphagia with pills.  Advised to take pills 1 at a time with plenty of water.  Offered referral to GI.  Patient wishes to wait at this time.  Decrease intake of foods known to cause GERD symptoms as reflux may contribute to dysphagia.  Bilateral cerumen impaction.  Consent obtained.  Bilateral ears irrigated.  OTC Debrox eardrops as needed.  Discomfort right shoulder likely 2/2 arthritis and AC joint.  Supportive care advised.  X-ray offered.  Will wait at this time.  Return if symptoms worsen or fail to improve.  Next CPE in 1 year.  Clotilda JONELLE Single, MD      [1]  Social History Tobacco Use   Smoking status: Former    Current packs/day: 0.00    Average packs/day: 0.3 packs/day for 2.0 years (0.5 ttl pk-yrs)    Types: Cigarettes    Start date: 06/25/1982    Quit date: 06/25/1984    Years since quitting: 40.0   Smokeless tobacco: Never  Substance Use Topics   Alcohol use: No   Drug  use: No  [2]  Allergies Allergen Reactions   Sudafed [Pseudoephedrine Hcl]     Heart races   "

## 2024-07-12 ENCOUNTER — Ambulatory Visit: Payer: Self-pay | Admitting: Family Medicine

## 2024-07-12 DIAGNOSIS — R779 Abnormality of plasma protein, unspecified: Secondary | ICD-10-CM

## 2024-07-12 DIAGNOSIS — R748 Abnormal levels of other serum enzymes: Secondary | ICD-10-CM

## 2024-07-14 ENCOUNTER — Other Ambulatory Visit

## 2024-07-24 ENCOUNTER — Other Ambulatory Visit (INDEPENDENT_AMBULATORY_CARE_PROVIDER_SITE_OTHER)

## 2024-07-24 DIAGNOSIS — R779 Abnormality of plasma protein, unspecified: Secondary | ICD-10-CM

## 2024-07-24 DIAGNOSIS — R748 Abnormal levels of other serum enzymes: Secondary | ICD-10-CM | POA: Diagnosis not present

## 2024-07-24 LAB — GAMMA GT: GGT: 14 U/L (ref 7–51)

## 2024-07-24 NOTE — Addendum Note (Signed)
 Addended by: MERCER KIRSCH R on: 07/24/2024 09:11 AM   Modules accepted: Orders

## 2024-07-30 LAB — MULTIPLE MYELOMA PANEL, SERUM
Albumin SerPl Elph-Mcnc: 3.9 g/dL (ref 2.9–4.4)
Albumin/Glob SerPl: 1 (ref 0.7–1.7)
Alpha 1: 0.3 g/dL (ref 0.0–0.4)
Alpha2 Glob SerPl Elph-Mcnc: 0.8 g/dL (ref 0.4–1.0)
B-Globulin SerPl Elph-Mcnc: 1.3 g/dL (ref 0.7–1.3)
Gamma Glob SerPl Elph-Mcnc: 1.7 g/dL (ref 0.4–1.8)
Globulin, Total: 4.1 g/dL — ABNORMAL HIGH (ref 2.2–3.9)
IgG (Immunoglobin G), Serum: 1663 mg/dL — ABNORMAL HIGH (ref 586–1602)
IgM (Immunoglobulin M), Srm: 130 mg/dL (ref 26–217)
Immunoglobulin A, (IgA) QN, Serum: 277 mg/dL (ref 87–352)
Total Protein: 8 g/dL (ref 6.0–8.5)
